# Patient Record
Sex: Female | Born: 1960 | Race: White | Hispanic: No | Marital: Married | Smoking: Former smoker
Health system: Southern US, Community
[De-identification: ages and names within clinical notes are randomized; demographics above are authoritative.]

## PROBLEM LIST (undated history)

## (undated) DIAGNOSIS — F419 Anxiety disorder, unspecified: Secondary | ICD-10-CM

## (undated) DIAGNOSIS — F32A Depression, unspecified: Secondary | ICD-10-CM

## (undated) DIAGNOSIS — K219 Gastro-esophageal reflux disease without esophagitis: Secondary | ICD-10-CM

## (undated) DIAGNOSIS — M1990 Unspecified osteoarthritis, unspecified site: Secondary | ICD-10-CM

## (undated) DIAGNOSIS — F329 Major depressive disorder, single episode, unspecified: Secondary | ICD-10-CM

## (undated) DIAGNOSIS — R7303 Prediabetes: Secondary | ICD-10-CM

## (undated) HISTORY — PX: HYSTEROSCOPY: SHX211

## (undated) HISTORY — PX: WISDOM TOOTH EXTRACTION: SHX21

---

## 1999-05-20 ENCOUNTER — Other Ambulatory Visit: Admission: RE | Admit: 1999-05-20 | Discharge: 1999-05-20 | Payer: Self-pay | Admitting: Gynecology

## 1999-11-04 ENCOUNTER — Ambulatory Visit (HOSPITAL_COMMUNITY): Admission: RE | Admit: 1999-11-04 | Discharge: 1999-11-04 | Payer: Self-pay | Admitting: Gynecology

## 1999-11-04 ENCOUNTER — Encounter (INDEPENDENT_AMBULATORY_CARE_PROVIDER_SITE_OTHER): Payer: Self-pay | Admitting: Specialist

## 2000-03-14 ENCOUNTER — Encounter: Admission: RE | Admit: 2000-03-14 | Discharge: 2000-03-14 | Payer: Self-pay | Admitting: Internal Medicine

## 2000-03-14 ENCOUNTER — Encounter: Payer: Self-pay | Admitting: Internal Medicine

## 2000-06-05 ENCOUNTER — Other Ambulatory Visit: Admission: RE | Admit: 2000-06-05 | Discharge: 2000-06-05 | Payer: Self-pay | Admitting: Gynecology

## 2000-08-03 ENCOUNTER — Encounter: Admission: RE | Admit: 2000-08-03 | Discharge: 2000-08-03 | Payer: Self-pay | Admitting: Gynecology

## 2000-08-03 ENCOUNTER — Encounter: Payer: Self-pay | Admitting: Gynecology

## 2001-07-01 ENCOUNTER — Other Ambulatory Visit: Admission: RE | Admit: 2001-07-01 | Discharge: 2001-07-01 | Payer: Self-pay | Admitting: Gynecology

## 2001-08-08 ENCOUNTER — Encounter: Admission: RE | Admit: 2001-08-08 | Discharge: 2001-08-08 | Payer: Self-pay | Admitting: Gynecology

## 2001-08-08 ENCOUNTER — Encounter: Payer: Self-pay | Admitting: Gynecology

## 2002-04-28 ENCOUNTER — Ambulatory Visit (HOSPITAL_COMMUNITY): Admission: RE | Admit: 2002-04-28 | Discharge: 2002-04-28 | Payer: Self-pay | Admitting: Surgery

## 2002-04-28 ENCOUNTER — Encounter: Payer: Self-pay | Admitting: Surgery

## 2002-04-30 ENCOUNTER — Encounter: Admission: RE | Admit: 2002-04-30 | Discharge: 2002-04-30 | Payer: Self-pay | Admitting: Surgery

## 2002-04-30 ENCOUNTER — Encounter: Payer: Self-pay | Admitting: Surgery

## 2002-07-07 ENCOUNTER — Other Ambulatory Visit: Admission: RE | Admit: 2002-07-07 | Discharge: 2002-07-07 | Payer: Self-pay | Admitting: Gynecology

## 2002-08-22 ENCOUNTER — Encounter: Admission: RE | Admit: 2002-08-22 | Discharge: 2002-08-22 | Payer: Self-pay | Admitting: Gynecology

## 2002-08-22 ENCOUNTER — Encounter: Payer: Self-pay | Admitting: Gynecology

## 2003-08-17 ENCOUNTER — Other Ambulatory Visit: Admission: RE | Admit: 2003-08-17 | Discharge: 2003-08-17 | Payer: Self-pay | Admitting: Gynecology

## 2003-08-25 ENCOUNTER — Encounter: Admission: RE | Admit: 2003-08-25 | Discharge: 2003-08-25 | Payer: Self-pay | Admitting: Gynecology

## 2003-08-25 ENCOUNTER — Encounter: Payer: Self-pay | Admitting: Gynecology

## 2004-08-29 ENCOUNTER — Other Ambulatory Visit: Admission: RE | Admit: 2004-08-29 | Discharge: 2004-08-29 | Payer: Self-pay | Admitting: Gynecology

## 2004-08-30 ENCOUNTER — Encounter: Admission: RE | Admit: 2004-08-30 | Discharge: 2004-08-30 | Payer: Self-pay | Admitting: Gynecology

## 2004-09-05 ENCOUNTER — Encounter: Admission: RE | Admit: 2004-09-05 | Discharge: 2004-09-05 | Payer: Self-pay | Admitting: Gynecology

## 2005-09-26 ENCOUNTER — Other Ambulatory Visit: Admission: RE | Admit: 2005-09-26 | Discharge: 2005-09-26 | Payer: Self-pay | Admitting: Gynecology

## 2005-11-14 ENCOUNTER — Encounter: Admission: RE | Admit: 2005-11-14 | Discharge: 2005-11-14 | Payer: Self-pay | Admitting: Gynecology

## 2006-10-30 ENCOUNTER — Other Ambulatory Visit: Admission: RE | Admit: 2006-10-30 | Discharge: 2006-10-30 | Payer: Self-pay | Admitting: Gynecology

## 2006-12-05 ENCOUNTER — Encounter: Admission: RE | Admit: 2006-12-05 | Discharge: 2006-12-05 | Payer: Self-pay | Admitting: Gynecology

## 2006-12-14 ENCOUNTER — Encounter: Admission: RE | Admit: 2006-12-14 | Discharge: 2006-12-14 | Payer: Self-pay | Admitting: Gynecology

## 2008-05-20 ENCOUNTER — Encounter: Admission: RE | Admit: 2008-05-20 | Discharge: 2008-05-20 | Payer: Self-pay | Admitting: Gynecology

## 2008-11-20 ENCOUNTER — Emergency Department (HOSPITAL_COMMUNITY): Admission: EM | Admit: 2008-11-20 | Discharge: 2008-11-20 | Payer: Self-pay | Admitting: Emergency Medicine

## 2009-06-30 ENCOUNTER — Encounter: Admission: RE | Admit: 2009-06-30 | Discharge: 2009-06-30 | Payer: Self-pay | Admitting: Gynecology

## 2010-07-06 ENCOUNTER — Encounter: Admission: RE | Admit: 2010-07-06 | Discharge: 2010-07-06 | Payer: Self-pay | Admitting: Gynecology

## 2011-01-08 ENCOUNTER — Encounter: Payer: Self-pay | Admitting: Gynecology

## 2011-07-13 ENCOUNTER — Other Ambulatory Visit: Payer: Self-pay | Admitting: Gynecology

## 2011-07-13 DIAGNOSIS — Z1231 Encounter for screening mammogram for malignant neoplasm of breast: Secondary | ICD-10-CM

## 2011-08-11 ENCOUNTER — Ambulatory Visit
Admission: RE | Admit: 2011-08-11 | Discharge: 2011-08-11 | Disposition: A | Discharge status: Home / Self Care | Payer: BC Managed Care – PPO | Source: Ambulatory Visit | Attending: Gynecology | Admitting: Gynecology

## 2011-08-11 DIAGNOSIS — Z1231 Encounter for screening mammogram for malignant neoplasm of breast: Secondary | ICD-10-CM

## 2011-08-12 ENCOUNTER — Emergency Department (HOSPITAL_BASED_OUTPATIENT_CLINIC_OR_DEPARTMENT_OTHER)
Admission: EM | Admit: 2011-08-12 | Discharge: 2011-08-12 | Disposition: A | Discharge status: Home / Self Care | Payer: BC Managed Care – PPO | Attending: Emergency Medicine | Admitting: Emergency Medicine

## 2011-08-12 ENCOUNTER — Other Ambulatory Visit: Payer: Self-pay

## 2011-08-12 ENCOUNTER — Encounter (HOSPITAL_BASED_OUTPATIENT_CLINIC_OR_DEPARTMENT_OTHER): Payer: Self-pay | Admitting: Emergency Medicine

## 2011-08-12 ENCOUNTER — Emergency Department (INDEPENDENT_AMBULATORY_CARE_PROVIDER_SITE_OTHER): Payer: BC Managed Care – PPO

## 2011-08-12 DIAGNOSIS — K219 Gastro-esophageal reflux disease without esophagitis: Secondary | ICD-10-CM | POA: Insufficient documentation

## 2011-08-12 DIAGNOSIS — R079 Chest pain, unspecified: Secondary | ICD-10-CM

## 2011-08-12 DIAGNOSIS — F341 Dysthymic disorder: Secondary | ICD-10-CM | POA: Insufficient documentation

## 2011-08-12 HISTORY — DX: Gastro-esophageal reflux disease without esophagitis: K21.9

## 2011-08-12 HISTORY — DX: Major depressive disorder, single episode, unspecified: F32.9

## 2011-08-12 HISTORY — DX: Anxiety disorder, unspecified: F41.9

## 2011-08-12 HISTORY — DX: Depression, unspecified: F32.A

## 2011-08-12 NOTE — ED Provider Notes (Signed)
History     CSN: [id number] Arrival date & time: 08/12/2011 11:12 AM  Chief Complaint  Patient presents with  . Chest Pain   HPI Complains of anterior chest pain onset 3 or 4 days ago initially intermittent, constant since 8 AM today which resolved without treatment upon arrival to the emergency department .brought on by stress. Patient has been under considerable emotional stress recently due to her mother's having a stroke and her daughters moving away to college. Discomfort is nonexertional over anterior chest pressure-like nonradiating no associated shortness of breath nausea or sweatiness. Asymptomatic as I examine her. On further history by telephone from Dr. Waynard Edwards, patient was seen in the office recently for similar complaint also felt secondary to emotional stress Past Medical History  Diagnosis Date  . Depression   . Anxiety   . GERD (gastroesophageal reflux disease)   . Migraine    cardiac risk factors none.  Past Surgical History  Procedure Date  . Cesarean section   . Hysteroscopy     History reviewed. No pertinent family history.  History  Substance Use Topics  . Smoking status: Former Games developer  . Smokeless tobacco: Not on file  . Alcohol Use: Yes     social   quit smoking 12 years ago  OB History    Grav Para Term Preterm Abortions TAB SAB Ect Mult Living                  Review of Systems  Constitutional: Negative.   HENT: Negative.   Respiratory: Negative.   Cardiovascular: Positive for chest pain.  Gastrointestinal: Negative.   Musculoskeletal: Negative.   Skin: Negative.   Neurological: Negative.   Hematological: Negative.   Psychiatric/Behavioral: Negative.        Anxiety    Physical Exam  BP 115/63  Pulse 66  Temp(Src) 97.8 F (36.6 C) (Oral)  Resp 16  Ht 5\' 7"  (1.702 m)  Wt 193 lb (87.544 kg)  BMI 30.23 kg/m2  SpO2 100%  Physical Exam  Nursing note and vitals reviewed. Constitutional: She appears well-developed and well-nourished.   HENT:  Head: Normocephalic and atraumatic.  Eyes: Conjunctivae are normal. Pupils are equal, round, and reactive to light.  Neck: Neck supple. No tracheal deviation present. No thyromegaly present.  Cardiovascular: Normal rate and regular rhythm.   No murmur heard. Pulmonary/Chest: Effort normal and breath sounds normal.  Abdominal: Soft. Bowel sounds are normal. She exhibits no distension. There is no tenderness.  Musculoskeletal: Normal range of motion. She exhibits no edema and no tenderness.  Neurological: She is alert. Coordination normal.  Skin: Skin is warm and dry. No rash noted.  Psychiatric: She has a normal mood and affect.    Date: 08/12/2011  Rate: 65  Rhythm: normal sinus rhythm  QRS Axis: normal  Intervals: normal  ST/T Wave abnormalities: normal  Conduction Disutrbances:none  Narrative Interpretation:   Old EKG Reviewed: none available  ED Course  Procedures  MDM Doubt cardiac etiology with atypical symptoms not acute EKG negative cardiac markers symptoms only brought on by stress. Plan with Dr. Cindee Lame, MD 08/12/11 [phone]

## 2011-08-12 NOTE — ED Notes (Signed)
Patient is resting comfortably. Has returned from XR; eating lunch brought by husband (MD aware); no needs at this time.

## 2011-08-12 NOTE — ED Notes (Signed)
Pt reports mid chest tightness x several days; more constant now; reports she is under a lot of stress at this time.

## 2011-09-22 LAB — POCT I-STAT, CHEM 8
Calcium, Ion: 1.14 mmol/L (ref 1.12–1.32)
Potassium: 3.4 mEq/L — ABNORMAL LOW (ref 3.5–5.1)
TCO2: 24 mmol/L (ref 0–100)

## 2011-09-22 LAB — PROTIME-INR
INR: 0.9 (ref 0.00–1.49)
Prothrombin Time: 12.2 seconds (ref 11.6–15.2)

## 2011-09-22 LAB — CBC
Hemoglobin: 12.4 g/dL (ref 12.0–15.0)
MCHC: 34.1 g/dL (ref 30.0–36.0)
MCV: 92.7 fL (ref 78.0–100.0)
Platelets: 316 10*3/uL (ref 150–400)
WBC: 6.2 10*3/uL (ref 4.0–10.5)

## 2012-09-02 ENCOUNTER — Other Ambulatory Visit: Payer: Self-pay | Admitting: Gynecology

## 2012-09-02 DIAGNOSIS — Z1231 Encounter for screening mammogram for malignant neoplasm of breast: Secondary | ICD-10-CM

## 2012-09-18 ENCOUNTER — Ambulatory Visit: Payer: BC Managed Care – PPO

## 2012-09-26 ENCOUNTER — Ambulatory Visit
Admission: RE | Admit: 2012-09-26 | Discharge: 2012-09-26 | Disposition: A | Discharge status: Home / Self Care | Payer: PRIVATE HEALTH INSURANCE | Source: Ambulatory Visit | Attending: Gynecology | Admitting: Gynecology

## 2012-09-26 DIAGNOSIS — Z1231 Encounter for screening mammogram for malignant neoplasm of breast: Secondary | ICD-10-CM

## 2013-12-05 ENCOUNTER — Other Ambulatory Visit: Payer: Self-pay

## 2013-12-05 DIAGNOSIS — Z1231 Encounter for screening mammogram for malignant neoplasm of breast: Secondary | ICD-10-CM

## 2014-01-06 ENCOUNTER — Ambulatory Visit
Admission: RE | Admit: 2014-01-06 | Discharge: 2014-01-06 | Disposition: A | Discharge status: Home / Self Care | Payer: BC Managed Care – PPO | Source: Ambulatory Visit

## 2014-01-06 DIAGNOSIS — Z1231 Encounter for screening mammogram for malignant neoplasm of breast: Secondary | ICD-10-CM

## 2018-11-05 ENCOUNTER — Encounter (HOSPITAL_BASED_OUTPATIENT_CLINIC_OR_DEPARTMENT_OTHER): Payer: Self-pay | Admitting: *Deleted

## 2018-11-05 ENCOUNTER — Other Ambulatory Visit: Payer: Self-pay

## 2018-11-05 ENCOUNTER — Emergency Department (HOSPITAL_BASED_OUTPATIENT_CLINIC_OR_DEPARTMENT_OTHER): Payer: BLUE CROSS/BLUE SHIELD

## 2018-11-05 ENCOUNTER — Emergency Department (HOSPITAL_BASED_OUTPATIENT_CLINIC_OR_DEPARTMENT_OTHER)
Admission: EM | Admit: 2018-11-05 | Discharge: 2018-11-05 | Disposition: A | Discharge status: Home / Self Care | Payer: BLUE CROSS/BLUE SHIELD | Attending: Emergency Medicine | Admitting: Emergency Medicine

## 2018-11-05 DIAGNOSIS — Z87891 Personal history of nicotine dependence: Secondary | ICD-10-CM | POA: Diagnosis not present

## 2018-11-05 DIAGNOSIS — Z3202 Encounter for pregnancy test, result negative: Secondary | ICD-10-CM | POA: Diagnosis not present

## 2018-11-05 DIAGNOSIS — K8080 Other cholelithiasis without obstruction: Secondary | ICD-10-CM | POA: Insufficient documentation

## 2018-11-05 DIAGNOSIS — Z79899 Other long term (current) drug therapy: Secondary | ICD-10-CM | POA: Insufficient documentation

## 2018-11-05 DIAGNOSIS — K8020 Calculus of gallbladder without cholecystitis without obstruction: Secondary | ICD-10-CM

## 2018-11-05 DIAGNOSIS — R1011 Right upper quadrant pain: Secondary | ICD-10-CM

## 2018-11-05 LAB — URINALYSIS, ROUTINE W REFLEX MICROSCOPIC
Bilirubin Urine: NEGATIVE
GLUCOSE, UA: NEGATIVE mg/dL
Ketones, ur: NEGATIVE mg/dL
Nitrite: NEGATIVE
PH: 7 (ref 5.0–8.0)
Protein, ur: NEGATIVE mg/dL
Specific Gravity, Urine: 1.015 (ref 1.005–1.030)

## 2018-11-05 LAB — CBC WITH DIFFERENTIAL/PLATELET
ABS IMMATURE GRANULOCYTES: 0.03 10*3/uL (ref 0.00–0.07)
BASOS ABS: 0.1 10*3/uL (ref 0.0–0.1)
Basophils Relative: 1 %
Eosinophils Absolute: 0.1 10*3/uL (ref 0.0–0.5)
Eosinophils Relative: 2 %
HCT: 39.4 % (ref 36.0–46.0)
HEMOGLOBIN: 13 g/dL (ref 12.0–15.0)
Immature Granulocytes: 0 %
LYMPHS PCT: 11 %
Lymphs Abs: 1.1 10*3/uL (ref 0.7–4.0)
MCH: 30.9 pg (ref 26.0–34.0)
MCHC: 33 g/dL (ref 30.0–36.0)
MCV: 93.6 fL (ref 80.0–100.0)
Monocytes Absolute: 0.7 10*3/uL (ref 0.1–1.0)
Monocytes Relative: 8 %
NEUTROS ABS: 7.3 10*3/uL (ref 1.7–7.7)
NEUTROS PCT: 78 %
NRBC: 0 % (ref 0.0–0.2)
PLATELETS: 305 10*3/uL (ref 150–400)
RBC: 4.21 MIL/uL (ref 3.87–5.11)
RDW: 12 % (ref 11.5–15.5)
WBC: 9.3 10*3/uL (ref 4.0–10.5)

## 2018-11-05 LAB — COMPREHENSIVE METABOLIC PANEL
ALBUMIN: 4 g/dL (ref 3.5–5.0)
ALK PHOS: 37 U/L — AB (ref 38–126)
ALT: 32 U/L (ref 0–44)
ANION GAP: 9 (ref 5–15)
AST: 52 U/L — ABNORMAL HIGH (ref 15–41)
BUN: 15 mg/dL (ref 6–20)
CHLORIDE: 105 mmol/L (ref 98–111)
CO2: 23 mmol/L (ref 22–32)
Calcium: 9.5 mg/dL (ref 8.9–10.3)
Creatinine, Ser: 0.67 mg/dL (ref 0.44–1.00)
GFR calc Af Amer: >60 mL/min (ref >60)
GFR calc non Af Amer: >60 mL/min (ref >60)
GLUCOSE: 97 mg/dL (ref 70–99)
Potassium: 3.8 mmol/L (ref 3.5–5.1)
SODIUM: 137 mmol/L (ref 135–145)
Total Bilirubin: 1.5 mg/dL — ABNORMAL HIGH (ref 0.3–1.2)
Total Protein: 7.2 g/dL (ref 6.5–8.1)

## 2018-11-05 LAB — URINALYSIS, MICROSCOPIC (REFLEX)

## 2018-11-05 LAB — I-STAT CG4 LACTIC ACID, ED: Lactic Acid, Venous: 0.71 mmol/L (ref 0.5–1.9)

## 2018-11-05 LAB — PREGNANCY, URINE: PREG TEST UR: NEGATIVE

## 2018-11-05 LAB — LIPASE, BLOOD: Lipase: 49 U/L (ref 11–51)

## 2018-11-05 MED ORDER — ONDANSETRON HCL 4 MG PO TABS: 4.0000 mg | ORAL_TABLET | Freq: Three times a day (TID) | ORAL | 0 refills | Status: DC | PRN

## 2018-11-05 MED ORDER — OXYCODONE HCL 5 MG PO TABS: 5.0000 mg | ORAL_TABLET | ORAL | 0 refills | Status: DC | PRN

## 2018-11-05 NOTE — Discharge Instructions (Signed)
Your work-up and ultrasound today revealed evidence of gallstones are likely causing her symptoms.  He did not have evidence of acute cholecystitis and your laboratory testing other than slight elevation of liver function was reassuring.  As he did not require significant pain medications at this time.  You were feeling well, we feel you are safe for outpatient management of this problem.  Please stay hydrated and have a bland diet avoiding greasy foods.  Please call both your PCP and a general surgeon for further outpatient management.  We provided prescription for pain medicine nausea medicine to help with your symptoms worsen however if the symptoms return and are unmanageable, please return to the nearest emergency department for further management.

## 2018-11-05 NOTE — ED Notes (Signed)
Patient transported to Ultrasound 

## 2018-11-05 NOTE — ED Provider Notes (Signed)
[facility] EMERGENCY DEPARTMENT Provider Note   CSN: [id number] Arrival date & time: 11/05/18  1232     History   Chief Complaint Chief Complaint  Patient presents with  . Abdominal Pain    HPI Mary Moran is a 57 y.o. female.  The history is provided by the patient, the spouse and medical records. No language interpreter was used.  Abdominal Pain   This is a recurrent problem. The current episode started 3 to 5 hours ago. The problem occurs constantly. The problem has been gradually improving. The pain is associated with eating. The pain is located in the RUQ. The quality of the pain is sharp and aching. The pain is severe. Associated symptoms include anorexia and nausea. Pertinent negatives include fever, diarrhea, vomiting, constipation, dysuria, frequency, hematuria and headaches. The symptoms are aggravated by palpation and eating. Nothing relieves the symptoms.    Past Medical History:  Diagnosis Date  . Anxiety   . Depression   . GERD (gastroesophageal reflux disease)   . Migraine     There are no active problems to display for this patient.   Past Surgical History:  Procedure Laterality Date  . CESAREAN SECTION    . HYSTEROSCOPY       OB History   None      Home Medications    Prior to Admission medications   Medication Sig Start Date End Date Taking? Authorizing Provider  cholecalciferol (VITAMIN D) 1000 UNITS tablet Take 1,000 Units by mouth daily.     Yes [provider]  fish oil-omega-3 fatty acids 1000 MG capsule Take 1 capsule by mouth daily.     Yes [provider]  Pantoprazole Sodium (PROTONIX PO) Take by mouth.   Yes [provider]  SERTRALINE HCL PO Take 150 mg by mouth daily.     Yes [provider]  BACLOFEN PO Take by mouth as needed.      [provider]  Multiple Vitamin (MULTIVITAMIN PO) Take 1 tablet by mouth daily.      [provider]  Topiramate (TOPAMAX PO)  Take 150 mg by mouth daily.      [provider]  vitamin C (ASCORBIC ACID) 500 MG tablet Take 500 mg by mouth daily.      [provider]    Family History No family history on file.  Social History Social History   Tobacco Use  . Smoking status: Former Games developermoker  . Smokeless tobacco: Never Used  Substance Use Topics  . Alcohol use: Yes    Comment: social  . Drug use: No     Allergies   Codeine   Review of Systems Review of Systems  Constitutional: Negative for chills, diaphoresis, fatigue and fever.  HENT: Negative for congestion.   Eyes: Negative for visual disturbance.  Respiratory: Negative for cough, chest tightness, shortness of breath and wheezing.   Cardiovascular: Negative for chest pain, palpitations and leg swelling.  Gastrointestinal: Positive for abdominal pain, anorexia and nausea. Negative for abdominal distention, constipation, diarrhea and vomiting.  Genitourinary: Positive for flank pain. Negative for decreased urine volume, dysuria, frequency, hematuria, pelvic pain, vaginal bleeding, vaginal discharge and vaginal pain.  Musculoskeletal: Positive for back pain (R flank). Negative for neck pain and neck stiffness.  Neurological: Negative for light-headedness, numbness and headaches.  Psychiatric/Behavioral: Negative for agitation.  All other systems reviewed and are negative.    Physical Exam Updated Vital Signs BP (!) 189/78 (BP Location: Right Arm)  Pulse 65   Temp 98.5 F (36.9 C) (Oral)   Resp 18   Ht 5\' 7"  (1.702 m)   Wt 88.5 kg   SpO2 99%   BMI 30.54 kg/m   Physical Exam  Constitutional: She appears well-developed and well-nourished. No distress.  HENT:  Head: Normocephalic and atraumatic.  Mouth/Throat: Oropharynx is clear and moist. No oropharyngeal exudate.  Eyes: Pupils are equal, round, and reactive to light. Conjunctivae and EOM are normal. No scleral icterus.  Neck: Neck supple.  Cardiovascular: Normal rate  and regular rhythm.  No murmur heard. Pulmonary/Chest: Effort normal and breath sounds normal. No respiratory distress. She has no wheezes. She has no rales. She exhibits no tenderness.  Abdominal: Soft. Normal appearance and bowel sounds are normal. She exhibits no distension. There is tenderness in the right upper quadrant and epigastric area. There is no rigidity, no rebound and no CVA tenderness.    Musculoskeletal: She exhibits tenderness. She exhibits no edema.       Thoracic back: She exhibits tenderness and pain.       Back:  Neurological: She is alert.  Skin: Skin is warm and dry. Capillary refill takes less than 2 seconds. She is not diaphoretic.  Psychiatric: She has a normal mood and affect.  Nursing note and vitals reviewed.    ED Treatments / Results  Labs (all labs ordered are listed, but only abnormal results are displayed) Labs Reviewed  URINALYSIS, ROUTINE W REFLEX MICROSCOPIC - Abnormal; Notable for the following components:      Result Value   Hgb urine dipstick TRACE (*)    Leukocytes, UA SMALL (*)    All other components within normal limits  URINALYSIS, MICROSCOPIC (REFLEX) - Abnormal; Notable for the following components:   Bacteria, UA FEW (*)    All other components within normal limits  COMPREHENSIVE METABOLIC PANEL - Abnormal; Notable for the following components:   AST 52 (*)    Alkaline Phosphatase 37 (*)    Total Bilirubin 1.5 (*)    All other components within normal limits  URINE CULTURE  CBC WITH DIFFERENTIAL/PLATELET  LIPASE, BLOOD  PREGNANCY, URINE  I-STAT CG4 LACTIC ACID, ED  I-STAT CG4 LACTIC ACID, ED    EKG None  Radiology US Abdomen Limited Ruq  Result Date: 11/05/2018 CLINICAL DATA:  Right upper quadrant abdominal pain EXAM: ULTRASOUND ABDOMEN LIMITED RIGHT UPPER QUADRANT COMPARISON:  Report from 04/28/2002 CT FINDINGS: Gallbladder: Physiologic distention of the gallbladder without mural thickening. The single wall thickness is  2.3 mm. No pericholecystic fluid is noted. Gallstones are seen along the dependent aspect of the gallbladder the largest approximately 1.2 cm near the neck. Common bile duct: Diameter: Normal at 4 mm Liver: Increased echogenicity of the liver parenchyma without space-occupying mass. Portal vein is patent on color Doppler imaging with normal direction of blood flow towards the liver. IMPRESSION: 1. Cholelithiasis without complicating features. 2. Mild increased in liver echogenicity may be seen with fatty infiltration/steatosis of the liver. Electronically Signed   By: Tollie Eth M.D.   On: 11/05/2018 15:19    Procedures Procedures (including critical care time)  Medications Ordered in ED Medications - No data to display   Initial Impression / Assessment and Plan / ED Course  I have reviewed the triage vital signs and the nursing notes.  Pertinent labs & imaging results that were available during my care of the patient were reviewed by me and considered in my medical decision making (see chart for details).  Mary Moran is a 57 y.o. female with a past medical history significant for anxiety, GERD, depression and migraines who presents for abdominal pain.  Patient reports that over the last year she is had 3 or 4 episodes of similar right upper quadrant and right flank abdominal pain.  She reports that typically it gets better after some rest and Alka-Seltzer.  She says that today she had breakfast and several minutes later started having the right upper quadrant and epigastric abdominal discomfort.  She describes as up to 10 out of 10 in severity and is currently a 3 out of 10.  She says that the pain is sharp and aching.  It radiates around her side towards her back.  She denies fevers or chills.  She reports that last week she had a URI and was given azithromycin which she completed.  She reports no other respiratory symptoms.  No chest pain or shortness of breath.  No palpitations.  She  reports nausea but no vomiting.  No constipation or diarrhea but she says she typically goes several days without a bowel movement.  No history of kidney stones, gallstones, cholecystitis, appendicitis, or diverticulitis.  No history of abdominal trauma.  No urinary symptoms or other GI symptoms to report.  No rashes or history of shingles on the side.  On exam, patient has tenderness in the epigastrium and right upper quadrant.  Bowel sounds were appreciated.  No report and flatus change.  Lungs clear chest was nontender.  CVA area nontender.  Based on her symptoms especially after eating, I am concerned patient may have a biliary etiology of symptoms.  Patient will have labs and be made n.p.o.  She will have her upper quadrant ultrasound initially.  Febrile child is reassuring and patient still having discomfort, will consider CT scan to further evaluate.    Anticipate reassessment after work-up.  She does not want pain or nausea medicine on arrival.  Patient's laboratory testing was reassuring.  Mild elevation in LFTs.  Patient's ultrasound showed cholelithiasis but no acute cholecystitis.  No pericholecystic fluid and negative Murphy sign.  Mild steatosis also seen.    Given her symptoms and description, I suspect that she is having symptom medic cholelithiasis.  As patient has not required pain medicine or nausea medicine here, she is likely safe for outpatient management of the gallstones causing her discomfort.  Patient will follow-up with her PCP and an outpatient surgeon for reassessment and further management.  I suspect patient will need a cholecystectomy in the near future.    Blood was seen in her urine however given the gallstones seen I suspect this is the cause rather than a kidney stone.  Patient was given strict return precautions for any new or worsened symptoms.  She was given prescription for pain and nausea medicine to help with the cholelithiasis symptoms return.  If she is  unable to eat or drink she understands return precautions.  Patient had no other worsens or concerns and was discharged in good condition with improved symptoms.  Final Clinical Impressions(s) / ED Diagnoses   Final diagnoses:  None    ED Discharge Orders    None      Clinical Impression: 1. Right upper quadrant abdominal pain   2. RUQ abdominal pain   3. Symptomatic cholelithiasis     Disposition: Discharge  Condition: Good  I have discussed the results, Dx and Tx plan with the pt(& family if present). He/she/they expressed understanding and agree(s) with the  plan. Discharge instructions discussed at great length. Strict return precautions discussed and pt &/or family have verbalized understanding of the instructions. No further questions at time of discharge.    Discharge Medication List as of 11/05/2018  3:53 PM    START taking these medications   Details  ondansetron (ZOFRAN) 4 MG tablet Take 1 tablet (4 mg total) by mouth every 8 (eight) hours as needed for nausea or vomiting., Starting Tue 11/05/2018, Print    oxyCODONE (ROXICODONE) 5 MG immediate release tablet Take 1 tablet (5 mg total) by mouth every 4 (four) hours as needed for severe pain., Starting Tue 11/05/2018, Print        Follow Up: Rodrigo Ran, MD [address] [place] [address] [phone]     Surgery, [place] [address] [place] [address] [phone]        Tegeler, Canary Brim, MD 11/05/18 [phone]

## 2018-11-05 NOTE — ED Triage Notes (Signed)
Abdominal pain this am. Pain feels like "heart burn". Pain is right upper quadrant pain.

## 2018-11-06 LAB — URINE CULTURE: CULTURE: NO GROWTH

## 2018-11-07 ENCOUNTER — Other Ambulatory Visit: Payer: Self-pay

## 2018-11-07 ENCOUNTER — Ambulatory Visit: Payer: Self-pay | Admitting: Surgery

## 2018-11-07 ENCOUNTER — Encounter (HOSPITAL_COMMUNITY): Payer: Self-pay | Admitting: *Deleted

## 2018-11-07 NOTE — H&P (View-Only) (Signed)
Surgical H&P  CC: abdominal pain  HPI: this is a very pleasant and relatively healthy 57 year old woman who is referred with biliary colic.  She has had 3 or 4 bouts of vague indigestion and upper abdominal pain in the last year, but 2 days ago had such a severe attack of upper middle and right abdominal pain associated with nausea that she presented to the med Center at [facility].  Her workup there included a right upper quadrant ultrasound that demonstrated gallstones but no cholecystitis, see report below.  She had no white blood cell count or fever but she had mild elevation in her AST and ALT, total bilirubin was 1.5.  The pain ultimately resolved and she was able to be discharged home.  She's not had any recurrence of the pain and today she is feeling well.  Has not had any jaundice.  No prior abdominal surgeries except for C-section.  Allergies  Allergen Reactions  . Codeine Nausea Only    Past Medical History:  Diagnosis Date  . Anxiety   . Depression   . GERD (gastroesophageal reflux disease)   . Migraine     Past Surgical History:  Procedure Laterality Date  . CESAREAN SECTION    . HYSTEROSCOPY      No family history on file.  Social History   Socioeconomic History  . Marital status: Married    Spouse name: Not on file  . Number of children: Not on file  . Years of education: Not on file  . Highest education level: Not on file  Occupational History  . Not on file  Social Needs  . Financial resource strain: Not on file  . Food insecurity:    Worry: Not on file    Inability: Not on file  . Transportation needs:    Medical: Not on file    Non-medical: Not on file  Tobacco Use  . Smoking status: Former Games developer  . Smokeless tobacco: Never Used  Substance and Sexual Activity  . Alcohol use: Yes    Comment: social  . Drug use: No  . Sexual activity: Not on file  Lifestyle  . Physical activity:    Days per week: Not on file    Minutes per session: Not on file   . Stress: Not on file  Relationships  . Social connections:    Talks on phone: Not on file    Gets together: Not on file    Attends religious service: Not on file    Active member of club or organization: Not on file    Attends meetings of clubs or organizations: Not on file    Relationship status: Not on file  Other Topics Concern  . Not on file  Social History Narrative  . Not on file    Current Outpatient Medications on File Prior to Visit  Medication Sig Dispense Refill  . BACLOFEN PO Take by mouth as needed.      . cholecalciferol (VITAMIN D) 1000 UNITS tablet Take 1,000 Units by mouth daily.      . fish oil-omega-3 fatty acids 1000 MG capsule Take 1 capsule by mouth daily.      . Multiple Vitamin (MULTIVITAMIN PO) Take 1 tablet by mouth daily.      . ondansetron (ZOFRAN) 4 MG tablet Take 1 tablet (4 mg total) by mouth every 8 (eight) hours as needed for nausea or vomiting. 12 tablet 0  . oxyCODONE (ROXICODONE) 5 MG immediate release tablet Take 1 tablet (  5 mg total) by mouth every 4 (four) hours as needed for severe pain. 12 tablet 0  . Pantoprazole Sodium (PROTONIX PO) Take by mouth.    . SERTRALINE HCL PO Take 150 mg by mouth daily.      . Topiramate (TOPAMAX PO) Take 150 mg by mouth daily.      . vitamin C (ASCORBIC ACID) 500 MG tablet Take 500 mg by mouth daily.       No current facility-administered medications on file prior to visit.     Review of Systems: a complete, 10pt review of systems was completed with pertinent positives and negatives as documented in the HPI  Physical Exam: 97.7. 103. 150/80. BMI 31 Gen: A&Ox3, no distress  Head: normocephalic, atraumatic Eyes: extraocular motions intact, anicteric.  Neck: supple without mass or thyromegaly Chest: unlabored respirations, symmetrical air entry, clear bilaterally   Cardiovascular: RRR with palpable distal pulses, no pedal edema Abdomen: soft, nondistended, very minimally subjectively tender in the  epigastrium and right upper quadrant. No mass or organomegaly.  Extremities: warm, without edema, no deformities  Neuro: grossly intact Psych: appropriate mood and affect, normal insight  Skin: warm and dry   CBC Latest Ref Rng & Units 11/05/2018 11/20/2008 11/20/2008  WBC 4.0 - 10.5 K/uL 9.3 - 6.2  Hemoglobin 12.0 - 15.0 g/dL 16.1 09.6 04.5  Hematocrit 36.0 - 46.0 % 39.4 36.0 36.3  Platelets 150 - 400 K/uL 305 - 316    CMP Latest Ref Rng & Units 11/05/2018 11/20/2008  Glucose 70 - 99 mg/dL 97 409(W)  BUN 6 - 20 mg/dL 15 11  Creatinine 1.19 - 1.00 mg/dL 1.47 0.9  Sodium 829 - 145 mmol/L 137 139  Potassium 3.5 - 5.1 mmol/L 3.8 3.4(L)  Chloride 98 - 111 mmol/L 105 106  CO2 22 - 32 mmol/L 23 -  Calcium 8.9 - 10.3 mg/dL 9.5 -  Total Protein 6.5 - 8.1 g/dL 7.2 -  Total Bilirubin 0.3 - 1.2 mg/dL 5.6(O) -  Alkaline Phos 38 - 126 U/L 37(L) -  AST 15 - 41 U/L 52(H) -  ALT 0 - 44 U/L 32 -    Lab Results  Component Value Date   INR 0.9 11/20/2008    Imaging: US Abdomen Limited Ruq  Result Date: 11/05/2018 CLINICAL DATA:  Right upper quadrant abdominal pain EXAM: ULTRASOUND ABDOMEN LIMITED RIGHT UPPER QUADRANT COMPARISON:  Report from 04/28/2002 CT FINDINGS: Gallbladder: Physiologic distention of the gallbladder without mural thickening. The single wall thickness is 2.3 mm. No pericholecystic fluid is noted. Gallstones are seen along the dependent aspect of the gallbladder the largest approximately 1.2 cm near the neck. Common bile duct: Diameter: Normal at 4 mm Liver: Increased echogenicity of the liver parenchyma without space-occupying mass. Portal vein is patent on color Doppler imaging with normal direction of blood flow towards the liver. IMPRESSION: 1. Cholelithiasis without complicating features. 2. Mild increased in liver echogenicity may be seen with fatty infiltration/steatosis of the liver. Electronically Signed   By: Tollie Eth M.D.   On: 11/05/2018 15:19     A/P: biliary  colic. I recommend proceeding with laparoscopic cholecystectomy with possible cholangiogram. Discussed risks of surgery including bleeding, pain, scarring, intraabdominal injury specifically to the common bile duct and sequelae, conversion to open surgery, blood clot, pneumonia, heart attack, stroke, failure to resolve symptoms, etc/ Questions welcomed and answered.She and her husband are going on a cruise on December 21 and are hoping to complete the surgery prior to this, I  referred him to our scheduling department to see what can be done.  Discussed low-fat diet and precautions about when to return to the emergency room.    Phylliss Blakeshelsea Henny Strauch, MD [facility] [place] Pager [phone]

## 2018-11-07 NOTE — H&P (Signed)
Surgical H&P  CC: abdominal pain  HPI: this is a very pleasant and relatively healthy 57-year-old woman who is referred with biliary colic.  She has had 3 or 4 bouts of vague indigestion and upper abdominal pain in the last year, but 2 days ago had such a severe attack of upper middle and right abdominal pain associated with nausea that she presented to the med Center at [place].  Her workup there included a right upper quadrant ultrasound that demonstrated gallstones but no cholecystitis, see report below.  She had no white blood cell count or fever but she had mild elevation in her AST and ALT, total bilirubin was 1.5.  The pain ultimately resolved and she was able to be discharged home.  She's not had any recurrence of the pain and today she is feeling well.  Has not had any jaundice.  No prior abdominal surgeries except for C-section.  Allergies  Allergen Reactions  . Codeine Nausea Only    Past Medical History:  Diagnosis Date  . Anxiety   . Depression   . GERD (gastroesophageal reflux disease)   . Migraine     Past Surgical History:  Procedure Laterality Date  . CESAREAN SECTION    . HYSTEROSCOPY      No family history on file.  Social History   Socioeconomic History  . Marital status: Married    Spouse name: Not on file  . Number of children: Not on file  . Years of education: Not on file  . Highest education level: Not on file  Occupational History  . Not on file  Social Needs  . Financial resource strain: Not on file  . Food insecurity:    Worry: Not on file    Inability: Not on file  . Transportation needs:    Medical: Not on file    Non-medical: Not on file  Tobacco Use  . Smoking status: Former Smoker  . Smokeless tobacco: Never Used  Substance and Sexual Activity  . Alcohol use: Yes    Comment: social  . Drug use: No  . Sexual activity: Not on file  Lifestyle  . Physical activity:    Days per week: Not on file    Minutes per session: Not on file   . Stress: Not on file  Relationships  . Social connections:    Talks on phone: Not on file    Gets together: Not on file    Attends religious service: Not on file    Active member of club or organization: Not on file    Attends meetings of clubs or organizations: Not on file    Relationship status: Not on file  Other Topics Concern  . Not on file  Social History Narrative  . Not on file    Current Outpatient Medications on File Prior to Visit  Medication Sig Dispense Refill  . BACLOFEN PO Take by mouth as needed.      . cholecalciferol (VITAMIN D) 1000 UNITS tablet Take 1,000 Units by mouth daily.      . fish oil-omega-3 fatty acids 1000 MG capsule Take 1 capsule by mouth daily.      . Multiple Vitamin (MULTIVITAMIN PO) Take 1 tablet by mouth daily.      . ondansetron (ZOFRAN) 4 MG tablet Take 1 tablet (4 mg total) by mouth every 8 (eight) hours as needed for nausea or vomiting. 12 tablet 0  . oxyCODONE (ROXICODONE) 5 MG immediate release tablet Take 1 tablet (  5 mg total) by mouth every 4 (four) hours as needed for severe pain. 12 tablet 0  . Pantoprazole Sodium (PROTONIX PO) Take by mouth.    . SERTRALINE HCL PO Take 150 mg by mouth daily.      . Topiramate (TOPAMAX PO) Take 150 mg by mouth daily.      . vitamin C (ASCORBIC ACID) 500 MG tablet Take 500 mg by mouth daily.       No current facility-administered medications on file prior to visit.     Review of Systems: a complete, 10pt review of systems was completed with pertinent positives and negatives as documented in the HPI  Physical Exam: 97.7. 103. 150/80. BMI 31 Gen: A&Ox3, no distress  Head: normocephalic, atraumatic Eyes: extraocular motions intact, anicteric.  Neck: supple without mass or thyromegaly Chest: unlabored respirations, symmetrical air entry, clear bilaterally   Cardiovascular: RRR with palpable distal pulses, no pedal edema Abdomen: soft, nondistended, very minimally subjectively tender in the  epigastrium and right upper quadrant. No mass or organomegaly.  Extremities: warm, without edema, no deformities  Neuro: grossly intact Psych: appropriate mood and affect, normal insight  Skin: warm and dry   CBC Latest Ref Rng & Units 11/05/2018 11/20/2008 11/20/2008  WBC 4.0 - 10.5 K/uL 9.3 - 6.2  Hemoglobin 12.0 - 15.0 g/dL 13.0 12.2 12.4  Hematocrit 36.0 - 46.0 % 39.4 36.0 36.3  Platelets 150 - 400 K/uL 305 - 316    CMP Latest Ref Rng & Units 11/05/2018 11/20/2008  Glucose 70 - 99 mg/dL 97 101(H)  BUN 6 - 20 mg/dL 15 11  Creatinine 0.44 - 1.00 mg/dL 0.67 0.9  Sodium 135 - 145 mmol/L 137 139  Potassium 3.5 - 5.1 mmol/L 3.8 3.4(L)  Chloride 98 - 111 mmol/L 105 106  CO2 22 - 32 mmol/L 23 -  Calcium 8.9 - 10.3 mg/dL 9.5 -  Total Protein 6.5 - 8.1 g/dL 7.2 -  Total Bilirubin 0.3 - 1.2 mg/dL 1.5(H) -  Alkaline Phos 38 - 126 U/L 37(L) -  AST 15 - 41 U/L 52(H) -  ALT 0 - 44 U/L 32 -    Lab Results  Component Value Date   INR 0.9 11/20/2008    Imaging: Us Abdomen Limited Ruq  Result Date: 11/05/2018 CLINICAL DATA:  Right upper quadrant abdominal pain EXAM: ULTRASOUND ABDOMEN LIMITED RIGHT UPPER QUADRANT COMPARISON:  Report from 04/28/2002 CT FINDINGS: Gallbladder: Physiologic distention of the gallbladder without mural thickening. The single wall thickness is 2.3 mm. No pericholecystic fluid is noted. Gallstones are seen along the dependent aspect of the gallbladder the largest approximately 1.2 cm near the neck. Common bile duct: Diameter: Normal at 4 mm Liver: Increased echogenicity of the liver parenchyma without space-occupying mass. Portal vein is patent on color Doppler imaging with normal direction of blood flow towards the liver. IMPRESSION: 1. Cholelithiasis without complicating features. 2. Mild increased in liver echogenicity may be seen with fatty infiltration/steatosis of the liver. Electronically Signed   By: David  Kwon M.D.   On: 11/05/2018 15:19     A/P: biliary  colic. I recommend proceeding with laparoscopic cholecystectomy with possible cholangiogram. Discussed risks of surgery including bleeding, pain, scarring, intraabdominal injury specifically to the common bile duct and sequelae, conversion to open surgery, blood clot, pneumonia, heart attack, stroke, failure to resolve symptoms, etc/ Questions welcomed and answered.She and her husband are going on a cruise on December 21 and are hoping to complete the surgery prior to this, I   referred him to our scheduling department to see what can be done.  Discussed low-fat diet and precautions about when to return to the emergency room.    Anitria Andon, MD [facility] Pager [phone]   

## 2018-11-11 MED ORDER — BUPIVACAINE LIPOSOME 1.3 % IJ SUSP
20.0000 mL | INTRAMUSCULAR | Status: DC
  Filled 2018-11-11: qty 20

## 2018-11-12 ENCOUNTER — Ambulatory Visit (HOSPITAL_COMMUNITY): Payer: BLUE CROSS/BLUE SHIELD | Admitting: Anesthesiology

## 2018-11-12 ENCOUNTER — Encounter (HOSPITAL_COMMUNITY)
Admission: RE | Disposition: A | Discharge status: Home / Self Care | Payer: Self-pay | Source: Ambulatory Visit | Attending: Surgery

## 2018-11-12 ENCOUNTER — Ambulatory Visit (HOSPITAL_COMMUNITY)
Admission: RE | Admit: 2018-11-12 | Discharge: 2018-11-12 | Disposition: A | Discharge status: Home / Self Care | Payer: BLUE CROSS/BLUE SHIELD | Source: Ambulatory Visit | Attending: Surgery | Admitting: Surgery

## 2018-11-12 ENCOUNTER — Other Ambulatory Visit: Payer: Self-pay

## 2018-11-12 ENCOUNTER — Ambulatory Visit (HOSPITAL_COMMUNITY): Payer: BLUE CROSS/BLUE SHIELD

## 2018-11-12 ENCOUNTER — Encounter (HOSPITAL_COMMUNITY): Payer: Self-pay | Admitting: Anesthesiology

## 2018-11-12 DIAGNOSIS — Z87891 Personal history of nicotine dependence: Secondary | ICD-10-CM | POA: Insufficient documentation

## 2018-11-12 DIAGNOSIS — K219 Gastro-esophageal reflux disease without esophagitis: Secondary | ICD-10-CM | POA: Insufficient documentation

## 2018-11-12 DIAGNOSIS — Z79899 Other long term (current) drug therapy: Secondary | ICD-10-CM | POA: Diagnosis not present

## 2018-11-12 DIAGNOSIS — F329 Major depressive disorder, single episode, unspecified: Secondary | ICD-10-CM | POA: Insufficient documentation

## 2018-11-12 DIAGNOSIS — K8010 Calculus of gallbladder with chronic cholecystitis without obstruction: Secondary | ICD-10-CM | POA: Diagnosis not present

## 2018-11-12 DIAGNOSIS — Z79890 Hormone replacement therapy (postmenopausal): Secondary | ICD-10-CM | POA: Insufficient documentation

## 2018-11-12 DIAGNOSIS — K8050 Calculus of bile duct without cholangitis or cholecystitis without obstruction: Secondary | ICD-10-CM

## 2018-11-12 DIAGNOSIS — K8070 Calculus of gallbladder and bile duct without cholecystitis without obstruction: Secondary | ICD-10-CM | POA: Diagnosis present

## 2018-11-12 HISTORY — DX: Prediabetes: R73.03

## 2018-11-12 HISTORY — DX: Unspecified osteoarthritis, unspecified site: M19.90

## 2018-11-12 HISTORY — PX: CHOLECYSTECTOMY: SHX55

## 2018-11-12 SURGERY — LAPAROSCOPIC CHOLECYSTECTOMY WITH INTRAOPERATIVE CHOLANGIOGRAM
Anesthesia: General | Site: Abdomen

## 2018-11-12 MED ORDER — OXYCODONE HCL 5 MG PO TABS: 5.0000 mg | ORAL_TABLET | Freq: Once | ORAL | Status: DC | PRN

## 2018-11-12 MED ORDER — FENTANYL CITRATE (PF) 100 MCG/2ML IJ SOLN
INTRAMUSCULAR | Status: AC
  Filled 2018-11-12: qty 2

## 2018-11-12 MED ORDER — CHLORHEXIDINE GLUCONATE 4 % EX LIQD: 60.0000 mL | Freq: Once | CUTANEOUS | Status: DC

## 2018-11-12 MED ORDER — IOPAMIDOL (ISOVUE-300) INJECTION 61%
INTRAVENOUS | Status: DC | PRN
  Administered 2018-11-12: 4 mL

## 2018-11-12 MED ORDER — BUPIVACAINE-EPINEPHRINE (PF) 0.25% -1:200000 IJ SOLN
INTRAMUSCULAR | Status: AC
  Filled 2018-11-12: qty 30

## 2018-11-12 MED ORDER — SUGAMMADEX SODIUM 200 MG/2ML IV SOLN
INTRAVENOUS | Status: DC | PRN
  Administered 2018-11-12: 200 mg via INTRAVENOUS

## 2018-11-12 MED ORDER — MIDAZOLAM HCL 2 MG/2ML IJ SOLN
INTRAMUSCULAR | Status: AC
  Filled 2018-11-12: qty 2

## 2018-11-12 MED ORDER — LACTATED RINGERS IV SOLN
INTRAVENOUS | Status: DC | PRN
  Administered 2018-11-12: 06:00:00 via INTRAVENOUS
  Administered 2018-11-12: 1000 mL via INTRAVENOUS

## 2018-11-12 MED ORDER — KETOROLAC TROMETHAMINE 30 MG/ML IJ SOLN: 30.0000 mg | Freq: Once | INTRAMUSCULAR | Status: DC | PRN

## 2018-11-12 MED ORDER — MIDAZOLAM HCL 5 MG/5ML IJ SOLN
INTRAMUSCULAR | Status: DC | PRN
  Administered 2018-11-12: 2 mg via INTRAVENOUS

## 2018-11-12 MED ORDER — LACTATED RINGERS IR SOLN
Status: DC | PRN
  Administered 2018-11-12: 1000 mL

## 2018-11-12 MED ORDER — ONDANSETRON HCL 4 MG/2ML IJ SOLN
INTRAMUSCULAR | Status: DC | PRN
  Administered 2018-11-12: 4 mg via INTRAVENOUS

## 2018-11-12 MED ORDER — DEXAMETHASONE SODIUM PHOSPHATE 10 MG/ML IJ SOLN
INTRAMUSCULAR | Status: DC | PRN
  Administered 2018-11-12: 10 mg via INTRAVENOUS

## 2018-11-12 MED ORDER — BUPIVACAINE-EPINEPHRINE 0.25% -1:200000 IJ SOLN
INTRAMUSCULAR | Status: DC | PRN
  Administered 2018-11-12: 27 mL

## 2018-11-12 MED ORDER — LIDOCAINE 2% (20 MG/ML) 5 ML SYRINGE
INTRAMUSCULAR | Status: AC
  Filled 2018-11-12: qty 10

## 2018-11-12 MED ORDER — GABAPENTIN 300 MG PO CAPS
300.0000 mg | ORAL_CAPSULE | ORAL | Status: AC
  Administered 2018-11-12: 300 mg via ORAL
  Filled 2018-11-12: qty 1

## 2018-11-12 MED ORDER — ROCURONIUM BROMIDE 10 MG/ML (PF) SYRINGE
PREFILLED_SYRINGE | INTRAVENOUS | Status: DC | PRN
  Administered 2018-11-12: 60 mg via INTRAVENOUS

## 2018-11-12 MED ORDER — 0.9 % SODIUM CHLORIDE (POUR BTL) OPTIME
TOPICAL | Status: DC | PRN
  Administered 2018-11-12: 1000 mL

## 2018-11-12 MED ORDER — PROMETHAZINE HCL 25 MG/ML IJ SOLN
INTRAMUSCULAR | Status: AC
  Filled 2018-11-12: qty 1

## 2018-11-12 MED ORDER — ACETAMINOPHEN 500 MG PO TABS
1000.0000 mg | ORAL_TABLET | ORAL | Status: AC
  Administered 2018-11-12: 1000 mg via ORAL
  Filled 2018-11-12: qty 2

## 2018-11-12 MED ORDER — FENTANYL CITRATE (PF) 100 MCG/2ML IJ SOLN
INTRAMUSCULAR | Status: DC | PRN
  Administered 2018-11-12 (×2): 50 ug via INTRAVENOUS

## 2018-11-12 MED ORDER — SUGAMMADEX SODIUM 200 MG/2ML IV SOLN
INTRAVENOUS | Status: AC
  Filled 2018-11-12: qty 2

## 2018-11-12 MED ORDER — FENTANYL CITRATE (PF) 100 MCG/2ML IJ SOLN: 25.0000 ug | INTRAMUSCULAR | Status: DC | PRN

## 2018-11-12 MED ORDER — OXYCODONE HCL 5 MG/5ML PO SOLN: 5.0000 mg | Freq: Once | ORAL | Status: DC | PRN

## 2018-11-12 MED ORDER — SCOPOLAMINE 1 MG/3DAYS TD PT72
MEDICATED_PATCH | TRANSDERMAL | Status: AC
  Administered 2018-11-12: 1.5 mg via TRANSDERMAL
  Filled 2018-11-12: qty 1

## 2018-11-12 MED ORDER — LACTATED RINGERS IV SOLN: INTRAVENOUS | Status: DC

## 2018-11-12 MED ORDER — PROMETHAZINE HCL 25 MG/ML IJ SOLN
6.2500 mg | INTRAMUSCULAR | Status: DC | PRN
  Administered 2018-11-12: 6.25 mg via INTRAVENOUS

## 2018-11-12 MED ORDER — LIDOCAINE 2% (20 MG/ML) 5 ML SYRINGE
INTRAMUSCULAR | Status: DC | PRN
  Administered 2018-11-12: 60 mg via INTRAVENOUS

## 2018-11-12 MED ORDER — PROPOFOL 10 MG/ML IV BOLUS
INTRAVENOUS | Status: DC | PRN
  Administered 2018-11-12: 150 mg via INTRAVENOUS

## 2018-11-12 MED ORDER — EPHEDRINE SULFATE-NACL 50-0.9 MG/10ML-% IV SOSY
PREFILLED_SYRINGE | INTRAVENOUS | Status: DC | PRN
  Administered 2018-11-12: 5 mg via INTRAVENOUS
  Administered 2018-11-12: 10 mg via INTRAVENOUS

## 2018-11-12 MED ORDER — DEXAMETHASONE SODIUM PHOSPHATE 10 MG/ML IJ SOLN
INTRAMUSCULAR | Status: AC
  Filled 2018-11-12: qty 2

## 2018-11-12 MED ORDER — SCOPOLAMINE 1 MG/3DAYS TD PT72
1.0000 | MEDICATED_PATCH | TRANSDERMAL | Status: DC
  Administered 2018-11-12: 1.5 mg via TRANSDERMAL

## 2018-11-12 MED ORDER — ROCURONIUM BROMIDE 100 MG/10ML IV SOLN
INTRAVENOUS | Status: AC
  Filled 2018-11-12: qty 2

## 2018-11-12 MED ORDER — PROPOFOL 10 MG/ML IV BOLUS
INTRAVENOUS | Status: AC
  Filled 2018-11-12: qty 40

## 2018-11-12 MED ORDER — CELECOXIB 200 MG PO CAPS
200.0000 mg | ORAL_CAPSULE | ORAL | Status: AC
  Administered 2018-11-12: 200 mg via ORAL
  Filled 2018-11-12: qty 1

## 2018-11-12 MED ORDER — CEFAZOLIN SODIUM-DEXTROSE 2-4 GM/100ML-% IV SOLN
2.0000 g | INTRAVENOUS | Status: AC
  Administered 2018-11-12: 2 g via INTRAVENOUS
  Filled 2018-11-12: qty 100

## 2018-11-12 MED ORDER — IOPAMIDOL (ISOVUE-300) INJECTION 61%
INTRAVENOUS | Status: AC
  Filled 2018-11-12: qty 50

## 2018-11-12 MED ORDER — ONDANSETRON HCL 4 MG/2ML IJ SOLN
INTRAMUSCULAR | Status: AC
  Filled 2018-11-12: qty 4

## 2018-11-12 SURGICAL SUPPLY — 36 items
ADH SKN CLS APL DERMABOND .7 (GAUZE/BANDAGES/DRESSINGS) ×1
APPLIER CLIP ROT 10 11.4 M/L (STAPLE) ×2
APR CLP MED LRG 11.4X10 (STAPLE) ×1
BAG SPEC RTRVL LRG 6X4 10 (ENDOMECHANICALS) ×1
CABLE HIGH FREQUENCY MONO STRZ (ELECTRODE) ×2 IMPLANT
CHLORAPREP W/TINT 26ML (MISCELLANEOUS) ×2 IMPLANT
CLIP APPLIE ROT 10 11.4 M/L (STAPLE) ×1 IMPLANT
COVER MAYO STAND STRL (DRAPES) IMPLANT
COVER SURGICAL LIGHT HANDLE (MISCELLANEOUS) ×2 IMPLANT
COVER WAND RF STERILE (DRAPES) IMPLANT
DECANTER SPIKE VIAL GLASS SM (MISCELLANEOUS) ×2 IMPLANT
DERMABOND ADVANCED (GAUZE/BANDAGES/DRESSINGS) ×1
DERMABOND ADVANCED .7 DNX12 (GAUZE/BANDAGES/DRESSINGS) ×1 IMPLANT
DRAPE C-ARM 42X120 X-RAY (DRAPES) IMPLANT
ELECT REM PT RETURN 15FT ADLT (MISCELLANEOUS) ×2 IMPLANT
GLOVE BIO SURGEON STRL SZ 6 (GLOVE) ×2 IMPLANT
GLOVE INDICATOR 6.5 STRL GRN (GLOVE) ×2 IMPLANT
GOWN STRL REUS W/TWL LRG LVL3 (GOWN DISPOSABLE) ×2 IMPLANT
GOWN STRL REUS W/TWL XL LVL3 (GOWN DISPOSABLE) ×4 IMPLANT
GRASPER SUT TROCAR 14GX15 (MISCELLANEOUS) IMPLANT
HEMOSTAT SNOW SURGICEL 2X4 (HEMOSTASIS) IMPLANT
KIT BASIN OR (CUSTOM PROCEDURE TRAY) ×2 IMPLANT
NDL INSUFFLATION 14GA 120MM (NEEDLE) ×1 IMPLANT
NEEDLE INSUFFLATION 14GA 120MM (NEEDLE) ×2 IMPLANT
POUCH SPECIMEN RETRIEVAL 10MM (ENDOMECHANICALS) ×2 IMPLANT
SCISSORS LAP 5X35 DISP (ENDOMECHANICALS) ×2 IMPLANT
SET CHOLANGIOGRAPH MIX (MISCELLANEOUS) IMPLANT
SET IRRIG TUBING LAPAROSCOPIC (IRRIGATION / IRRIGATOR) ×2 IMPLANT
SLEEVE XCEL OPT CAN 5 100 (ENDOMECHANICALS) ×4 IMPLANT
SUT MNCRL AB 4-0 PS2 18 (SUTURE) ×2 IMPLANT
TOWEL OR 17X26 10 PK STRL BLUE (TOWEL DISPOSABLE) ×2 IMPLANT
TOWEL OR NON WOVEN STRL DISP B (DISPOSABLE) IMPLANT
TRAY LAPAROSCOPIC (CUSTOM PROCEDURE TRAY) ×2 IMPLANT
TROCAR BLADELESS OPT 5 100 (ENDOMECHANICALS) ×2 IMPLANT
TROCAR XCEL 12X100 BLDLESS (ENDOMECHANICALS) ×2 IMPLANT
TUBING INSUF HEATED (TUBING) ×2 IMPLANT

## 2018-11-12 NOTE — Transfer of Care (Signed)
Immediate Anesthesia Transfer of Care Note  Patient: Mary Moran  Procedure(s) Performed: Procedure(s): LAPAROSCOPIC CHOLECYSTECTOMY WITH INTRAOPERATIVE CHOLANGIOGRAM (N/A)  Patient Location: PACU  Anesthesia Type:General  Level of Consciousness:  sedated, patient cooperative and responds to stimulation  Airway & Oxygen Therapy:Patient Spontanous Breathing and Patient connected to face mask oxgen  Post-op Assessment:  Report given to PACU RN and Post -op Vital signs reviewed and stable  Post vital signs:  Reviewed and stable  Last Vitals:  Vitals:   11/12/18 0828 11/12/18 0830  BP: (!) 150/77 (!) 156/69  Pulse: 82 84  Resp: 17 (!) 24  Temp: (P) 36.4 C   SpO2: 99% 97%    Complications: No apparent anesthesia complications

## 2018-11-12 NOTE — Anesthesia Preprocedure Evaluation (Signed)
Anesthesia Evaluation  Patient identified by MRN, date of birth, ID band Patient awake    Reviewed: Allergy & Precautions, NPO status , Patient's Chart, lab work & pertinent test results  Airway Mallampati: II  TM Distance: >3 FB Neck ROM: Full    Dental no notable dental hx.    Pulmonary neg pulmonary ROS, former smoker,    Pulmonary exam normal breath sounds clear to auscultation       Cardiovascular negative cardio ROS Normal cardiovascular exam Rhythm:Regular Rate:Normal     Neuro/Psych negative neurological ROS  negative psych ROS   GI/Hepatic negative GI ROS, Neg liver ROS,   Endo/Other  negative endocrine ROS  Renal/GU negative Renal ROS  negative genitourinary   Musculoskeletal negative musculoskeletal ROS (+)   Abdominal   Peds negative pediatric ROS (+)  Hematology negative hematology ROS (+)   Anesthesia Other Findings   Reproductive/Obstetrics negative OB ROS                             Anesthesia Physical Anesthesia Plan  ASA: II  Anesthesia Plan: General   Post-op Pain Management:    Induction: Intravenous  PONV Risk Score and Plan: 3 and Ondansetron, Dexamethasone, Treatment may vary due to age or medical condition and Midazolam  Airway Management Planned: Oral ETT  Additional Equipment:   Intra-op Plan:   Post-operative Plan: Extubation in OR  Informed Consent: I have reviewed the patients History and Physical, chart, labs and discussed the procedure including the risks, benefits and alternatives for the proposed anesthesia with the patient or authorized representative who has indicated his/her understanding and acceptance.   Dental advisory given  Plan Discussed with: CRNA and Surgeon  Anesthesia Plan Comments:         Anesthesia Quick Evaluation

## 2018-11-12 NOTE — Interval H&P Note (Signed)
History and Physical Interval Note:  11/12/2018 7:02 AM  Mary Moran  has presented today for surgery, with the diagnosis of biliary colic  The various methods of treatment have been discussed with the patient and family. After consideration of risks, benefits and other options for treatment, the patient has consented to  Procedure(s): LAPAROSCOPIC CHOLECYSTECTOMY WITH INTRAOPERATIVE CHOLANGIOGRAM (N/A) as a surgical intervention .  The patient's history has been reviewed, patient examined, no change in status, stable for surgery.  I have reviewed the patient's chart and labs.  Questions were answered to the patient's satisfaction.     Jaymeson Mengel Lollie SailsA [name]

## 2018-11-12 NOTE — Anesthesia Procedure Notes (Signed)
Procedure Name: Intubation Date/Time: 11/12/2018 7:42 AM Performed by: Lavina Hamman, CRNA Pre-anesthesia Checklist: Patient identified, Emergency Drugs available, Suction available, Patient being monitored and Timeout performed Patient Re-evaluated:Patient Re-evaluated prior to induction Oxygen Delivery Method: Circle system utilized Preoxygenation: Pre-oxygenation with 100% oxygen Induction Type: IV induction Ventilation: Mask ventilation without difficulty Laryngoscope Size: Mac and 4 Grade View: Grade I Tube type: Oral Tube size: 7.0 mm Number of attempts: 1 Airway Equipment and Method: Stylet Placement Confirmation: ETT inserted through vocal cords under direct vision,  positive ETCO2,  CO2 detector and breath sounds checked- equal and bilateral Secured at: 21 cm Tube secured with: Tape Dental Injury: Teeth and Oropharynx as per pre-operative assessment

## 2018-11-12 NOTE — Discharge Instructions (Addendum)
LAPAROSCOPIC SURGERY: POST OP INSTRUCTIONS  ######################################################################  EAT Gradually transition to a high fiber diet with a fiber supplement over the next few weeks after discharge.  Start with a pureed / full liquid diet (see below)  WALK Walk an hour a day.  Control your pain to do that.    CONTROL PAIN Control pain so that you can walk, sleep, tolerate sneezing/coughing, go up/down stairs.  HAVE A BOWEL MOVEMENT DAILY Keep your bowels regular to avoid problems.  OK to try a laxative to override constipation.  OK to use an antidairrheal to slow down diarrhea.  Call if not better after 2 tries  CALL IF YOU HAVE PROBLEMS/CONCERNS Call if you are still struggling despite following these instructions. Call if you have concerns not answered by these instructions  ######################################################################    1. DIET: Follow a light bland diet the first 24 hours after arrival home, such as soup, liquids, crackers, etc.  Be sure to include lots of fluids daily.  Avoid fast food or heavy meals as your are more likely to get nauseated.  Eat a low fat the next few days after surgery.    2. Take your usually prescribed home medications unless otherwise directed.  3. PAIN CONTROL: a. Pain is best controlled by a usual combination of three different methods TOGETHER: i. Ice/Heat ii. Over the counter pain medication iii. Prescription pain medication b. Most patients will experience some swelling and bruising around the incisions.  Ice packs or heating pads (30-60 minutes up to 6 times a day) will help. Use ice for the first few days to help decrease swelling and bruising, then switch to heat to help relax tight/sore spots and speed recovery.  Some people prefer to use ice alone, heat alone, alternating between ice & heat.  Experiment to what works for you.  Swelling and bruising can take several weeks to resolve.   c. It  is helpful to take an over-the-counter pain medication regularly for the first few weeks.  Choose one of the following that works best for you: i. Naproxen (Aleve, etc)  Two 220mg  tabs twice a day ii. Ibuprofen (Advil, etc) Three 200mg  tabs four times a day (every meal & bedtime) iii. Acetaminophen (Tylenol, etc) 500-650mg  four times a day (every meal & bedtime) d. A  prescription for pain medication (such as oxycodone, hydrocodone, tramadol, gabapentin, methocarbamol, etc) was given to you by the ER last week.  Take your pain medication as prescribed if needed.  i. If you are having problems/concerns with the prescription medicine (does not control pain, nausea, vomiting, rash, itching, etc), please call us [phone] to see if we need to switch you to a different pain medicine that will work better for you and/or control your side effect better. ii. If you need a refill on your pain medication, please give [place] 48 hour notice.  contact your pharmacy.  They will contact our office to request authorization. Prescriptions will not be filled after 5 pm or on week-ends  4. Avoid getting constipated.   a. Between the surgery and the pain medications, it is common to experience some constipation.   b. Increasing fluid intake and taking a fiber supplement (such as Metamucil, Citrucel, FiberCon, MiraLax, etc) 1-2 times a day regularly will usually help prevent this problem from occurring.   c. A mild laxative (prune juice, Milk of Magnesia, MiraLax, etc) should be taken according to package directions if there are no bowel movements after 48 hours.  5. Watch out for diarrhea.   a. If you have many loose bowel movements, simplify your diet to bland foods & liquids for a few days.   b. Stop any stool softeners and decrease your fiber supplement.   c. Switching to mild anti-diarrheal medications (Kayopectate, Pepto Bismol) can help.   d. If this worsens or does not improve, please call us.  6. Wash /  shower every day.  You may shower over the skin glue which is waterproof.  Continue to shower over incision(s) after the dressing is off.  7. Skin glue will flake off after 2 weeks.  You may leave the incision open to air.  You may replace a dressing/Band-Aid to cover the incision for comfort if you wish.   8. ACTIVITIES as tolerated:   a. You may resume regular (light) daily activities beginning the next day--such as daily self-care, walking, climbing stairs--gradually increasing activities as tolerated.  If you can walk 30 minutes without difficulty, it is safe to try more intense activity such as jogging, treadmill, bicycling, low-impact aerobics, swimming, etc. b. Save the most intensive and strenuous activity for last such as sit-ups, heavy lifting, contact sports, etc  Refrain from any heavy lifting or straining until you are off narcotics for pain control.   c. DO NOT PUSH THROUGH PAIN.  Let pain be your guide: If it hurts to do something, don't do it.  Pain is your body warning you to avoid that activity for another week until the pain goes down. d. You may drive when you are no longer taking prescription pain medication, you can comfortably wear a seatbelt, and you can safely maneuver your car and apply brakes. e. Bonita Quin may have sexual intercourse when it is comfortable.  9. FOLLOW UP in our office a. Please call [facility] at [phone] to set up an appointment to see your surgeon in the office for a follow-up appointment approximately 2-3 weeks after your surgery. b. Make sure that you call for this appointment the day you arrive home to insure a convenient appointment time.  10. IF YOU HAVE DISABILITY OR FAMILY LEAVE FORMS, BRING THEM TO THE OFFICE FOR PROCESSING.  DO NOT GIVE THEM TO YOUR DOCTOR.   WHEN TO CALL us [phone]: 1. Poor pain control 2. Reactions / problems with new medications (rash/itching, nausea, etc)  3. Fever over 101.5 F (38.5 C) 4. Inability to  urinate 5. Nausea and/or vomiting 6. Worsening swelling or bruising 7. Continued bleeding from incision. 8. Increased pain, redness, or drainage from the incision   The clinic staff is available to answer your questions during regular business hours (8:30am-5pm).  Please dont hesitate to call and ask to speak to one of our nurses for clinical concerns.   If you have a medical emergency, go to the nearest emergency room or call 911.  A surgeon from [facility] is always on call at the [facility], [place] [address], [place], [place]  [address] ? MAIN: [phone] ? TOLL FREE: [phone] ?  FAX [phone] [email or web address]  General Anesthesia, Adult, Care After These instructions provide you with information about caring for yourself after your procedure. Your health care provider may also give you more specific instructions. Your treatment has been planned according to current medical practices, but problems sometimes occur. Call your health care provider if you have any problems or questions after your procedure. What can I expect after the procedure?  After the procedure, it is common to have:  Vomiting.  A sore throat.  Mental slowness.  It is common to feel:  Nauseous.  Cold or shivery.  Sleepy.  Tired.  Sore or achy, even in parts of your body where you did not have surgery.  Follow these instructions at home: For at least 24 hours after the procedure:  Do not: ? Participate in activities where you could fall or become injured. ? Drive. ? Use heavy machinery. ? Drink alcohol. ? Take sleeping pills or medicines that cause drowsiness. ? Make important decisions or sign legal documents. ? Take care of children on your own.  Rest. Eating and drinking  If you vomit, drink water, juice, or soup when you can drink without vomiting.  Drink enough fluid to keep your urine clear or pale  yellow.  Make sure you have little or no nausea before eating solid foods.  Follow the diet recommended by your health care provider. General instructions  Have a responsible adult stay with you until you are awake and alert.  Return to your normal activities as told by your health care provider. Ask your health care provider what activities are safe for you.  Take over-the-counter and prescription medicines only as told by your health care provider.  If you smoke, do not smoke without supervision.  Keep all follow-up visits as told by your health care provider. This is important. Contact a health care provider if:  You continue to have nausea or vomiting at home, and medicines are not helpful.  You cannot drink fluids or start eating again.  You cannot urinate after 8-12 hours.  You develop a skin rash.  You have fever.  You have increasing redness at the site of your procedure. Get help right away if:  You have difficulty breathing.  You have chest pain.  You have unexpected bleeding.  You feel that you are having a life-threatening or urgent problem. This information is not intended to replace advice given to you by your health care provider. Make sure you discuss any questions you have with your health care provider. Document Released: 03/12/2001 Document Revised: 05/08/2016 Document Reviewed: 11/18/2015 Elsevier Interactive Patient Education  [employer]2018 [employer]

## 2018-11-12 NOTE — Anesthesia Postprocedure Evaluation (Signed)
Anesthesia Post Note  Patient: Mary Moran  Procedure(s) Performed: LAPAROSCOPIC CHOLECYSTECTOMY WITH INTRAOPERATIVE CHOLANGIOGRAM (N/A Abdomen)     Patient location during evaluation: PACU Anesthesia Type: General Level of consciousness: awake and alert Pain management: pain level controlled Vital Signs Assessment: post-procedure vital signs reviewed and stable Respiratory status: spontaneous breathing, nonlabored ventilation, respiratory function stable and patient connected to nasal cannula oxygen Cardiovascular status: blood pressure returned to baseline and stable Postop Assessment: no apparent nausea or vomiting Anesthetic complications: no    Last Vitals:  Vitals:   11/12/18 0828 11/12/18 0830  BP: (!) 150/77 (!) 156/69  Pulse: 82 84  Resp: 17 (!) 24  Temp: 36.4 C   SpO2: 99% 97%    Last Pain:  Vitals:   11/12/18 0900  TempSrc:   PainSc: Asleep                 [place] Pelto S

## 2018-11-12 NOTE — Op Note (Signed)
Operative Note  Alinda SierrasSharleene Fiumara 57 y.o. female [id number]  11/12/2018  Surgeon: Berna Buehelsea A Tatiyana Foucher MD  Assistant: none  Procedure performed: Laparoscopic Cholecystectomy with cholangiogram  Preop diagnosis: biliary colic Post-op diagnosis/intraop findings: same  Specimens: gallbladder  EBL: minimal  Complications: none  Description of procedure: After obtaining informed consent the patient was brought to the operating room. Prophylactic antibiotics were administered. SCD's were applied. General endotracheal anesthesia was initiated and a formal time-out was performed. The abdomen was prepped and draped in the usual sterile fashion and the abdomen was entered using an infraumbilical veress needle after instilling the site with local. Insufflation to 15mmHg was obtained, 5mm trocar and camera inserted and gross inspection revealed no evidence of injury from our entry or other intraabdominal abnormalities. Two 5mm trocars were introduced in the right midclavicular and right anterior axillary lines under direct visualization and following infiltration with local. An 11mm trocar was placed in the epigastrium. The gallbladder was distended but blue in color without active inflammation. It was retracted cephalad and the infundibulum was retracted laterally. A combination of hook electrocautery and blunt dissection was utilized to clear the peritoneum from the neck and cystic duct, circumferentially isolating the cystic artery and cystic duct and lifting the gallbladder from the cystic plate. The critical view of safety was achieved with the cystic artery, cystic duct, and liver bed visualized between them with no other structures. The artery was clipped with a single clip proximally and distally and divided. A clip was placed on the distal cystic duct and a ductotomy sharply made into which the cholangiogram catheter was inserted and secured with a clip. Cholangiogram was performed which shows normal  ductal anatomy and ready emptying of contrast into the duodenum, no filling defects.  The catheter is removed and the proximal cystic duct ligated with 3 clips and divided distally.  The gallbladder was dissected from the liver plate using electrocautery. Once freed the gallbladder was placed in an endocatch bag and removed intact through the epigastric trocar site.  The right upper quadrant was irrigated and aspirated; the effluent was clear. Hemostasis was once again confirmed, and reinspection of the abdomen revealed no injuries. The clips were well opposed without any bile leak from the duct or the liver bed. The 11mm trocar site in the epigastrium was closed with a 0 vicryl in the fascia under direct visualization using a PMI device. The abdomen was desufflated and all trocars removed. The skin incisions were closed with running subcuticular monocryl and Dermabond. The patient was awakened, extubated and transported to the recovery room in stable condition.   All counts were correct at the completion of the case.

## 2018-11-13 ENCOUNTER — Encounter (HOSPITAL_COMMUNITY): Payer: Self-pay | Admitting: Surgery

## 2019-07-31 ENCOUNTER — Other Ambulatory Visit: Payer: Self-pay | Admitting: Family Medicine

## 2019-07-31 DIAGNOSIS — M79671 Pain in right foot: Secondary | ICD-10-CM

## 2019-08-08 ENCOUNTER — Ambulatory Visit
Admission: RE | Admit: 2019-08-08 | Discharge: 2019-08-08 | Disposition: A | Discharge status: Home / Self Care | Payer: BC Managed Care – PPO | Source: Ambulatory Visit | Attending: Family Medicine | Admitting: Family Medicine

## 2019-08-08 DIAGNOSIS — M79671 Pain in right foot: Secondary | ICD-10-CM

## 2019-09-03 ENCOUNTER — Other Ambulatory Visit: Payer: Self-pay | Admitting: Gynecology

## 2019-09-03 DIAGNOSIS — R928 Other abnormal and inconclusive findings on diagnostic imaging of breast: Secondary | ICD-10-CM

## 2019-09-15 ENCOUNTER — Other Ambulatory Visit: Payer: Self-pay | Admitting: Gynecology

## 2019-09-15 ENCOUNTER — Other Ambulatory Visit: Payer: Self-pay

## 2019-09-15 ENCOUNTER — Ambulatory Visit
Admission: RE | Admit: 2019-09-15 | Discharge: 2019-09-15 | Disposition: A | Discharge status: Home / Self Care | Payer: BC Managed Care – PPO | Source: Ambulatory Visit | Attending: Gynecology | Admitting: Gynecology

## 2019-09-15 DIAGNOSIS — N6310 Unspecified lump in the right breast, unspecified quadrant: Secondary | ICD-10-CM

## 2019-09-15 DIAGNOSIS — R928 Other abnormal and inconclusive findings on diagnostic imaging of breast: Secondary | ICD-10-CM

## 2019-09-22 IMAGING — RF DG CHOLANGIOGRAM OPERATIVE
1 series · 4 of 4 positions shown · non-contrast
Comparison: 11/05/2008

CLINICAL DATA: Biliary colic

EXAM:
INTRAOPERATIVE CHOLANGIOGRAM
TECHNIQUE: Cholangiographic images from the C-arm fluoroscopic device were
submitted for interpretation post-operatively. Please see the
procedural report for the amount of contrast and the fluoroscopy
time utilized.

[Series 1: run · 4 of 74 frames shown]
[frame 12/74]
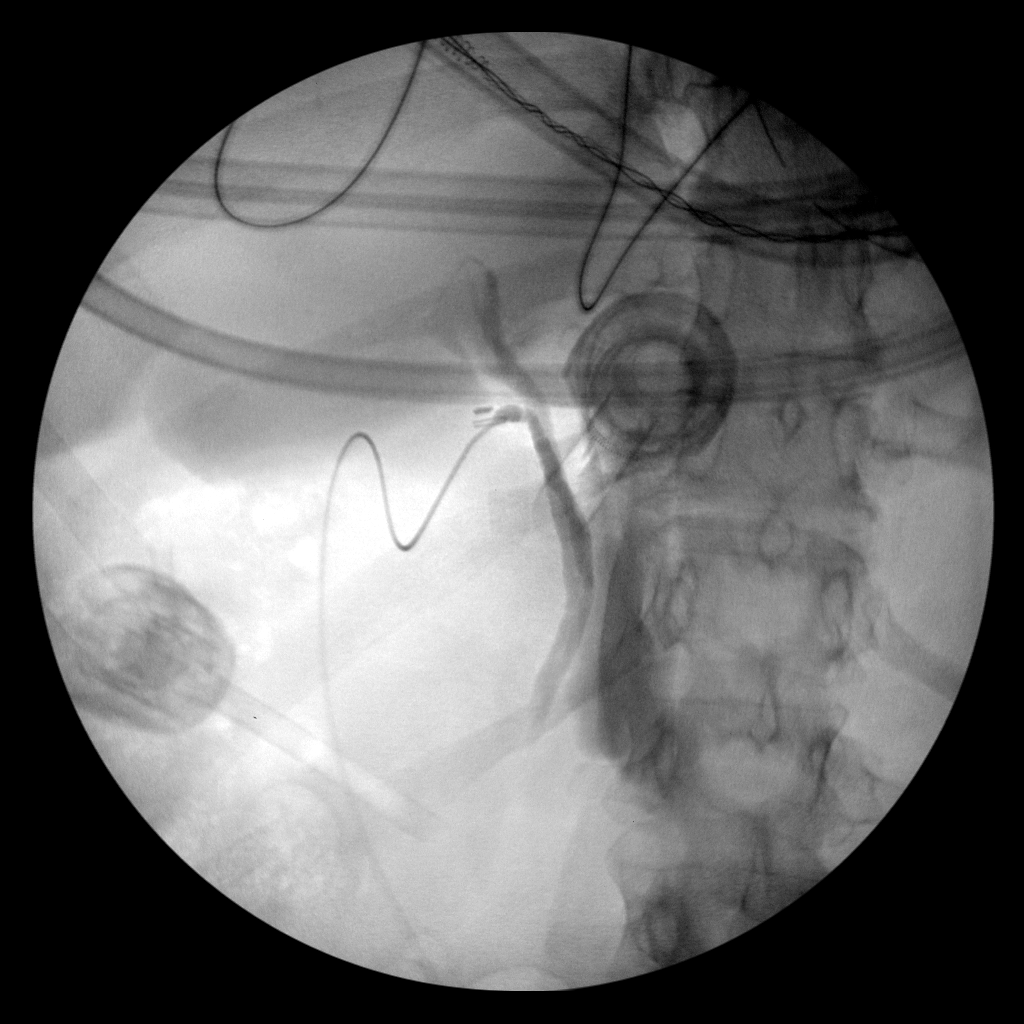
[frame 38/74]
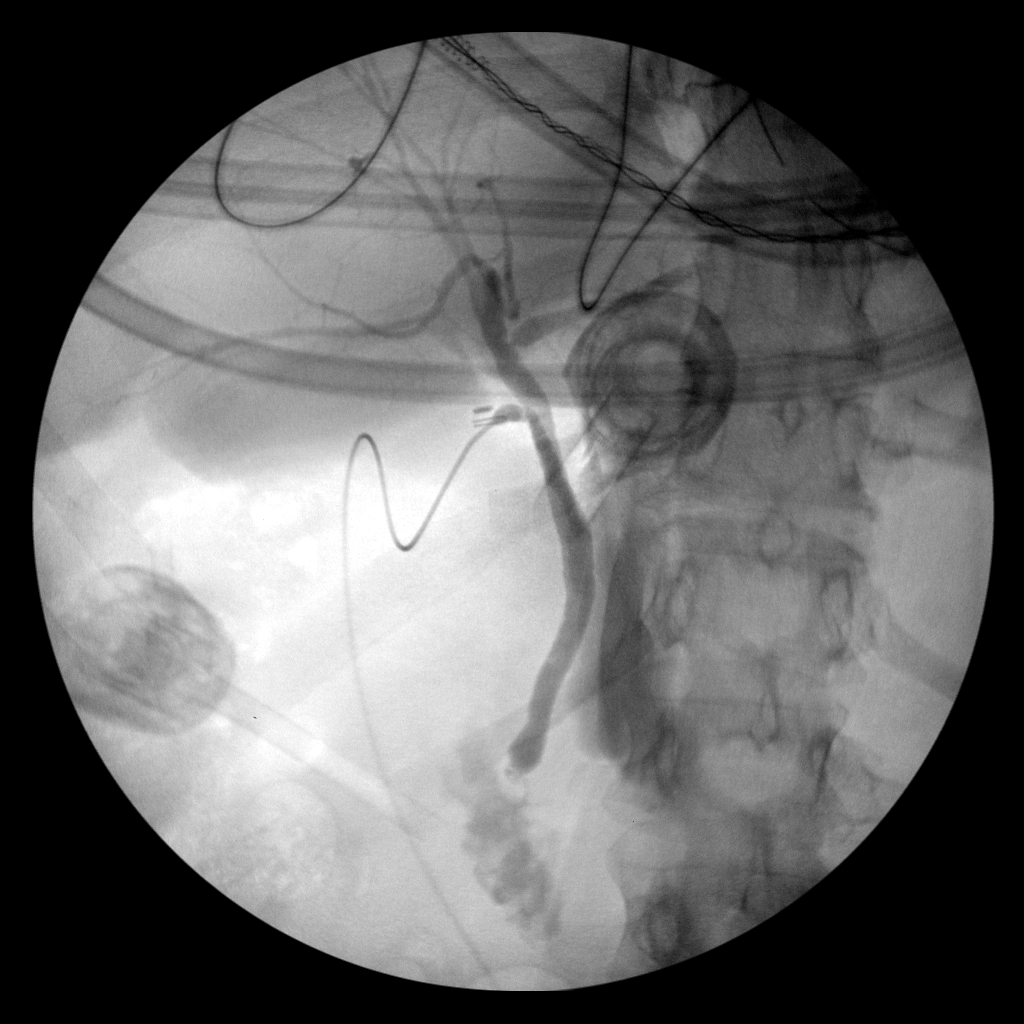
[frame 63/74]
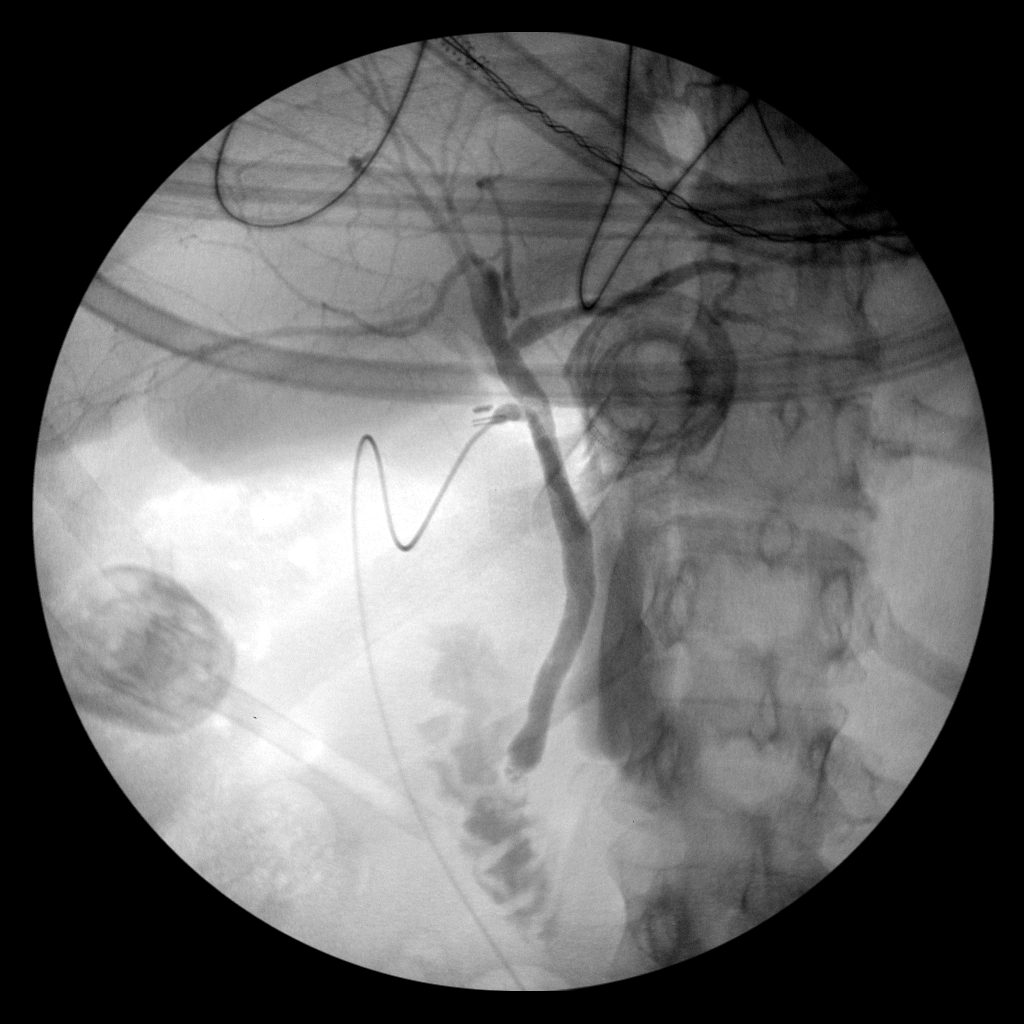
[frame 74/74]
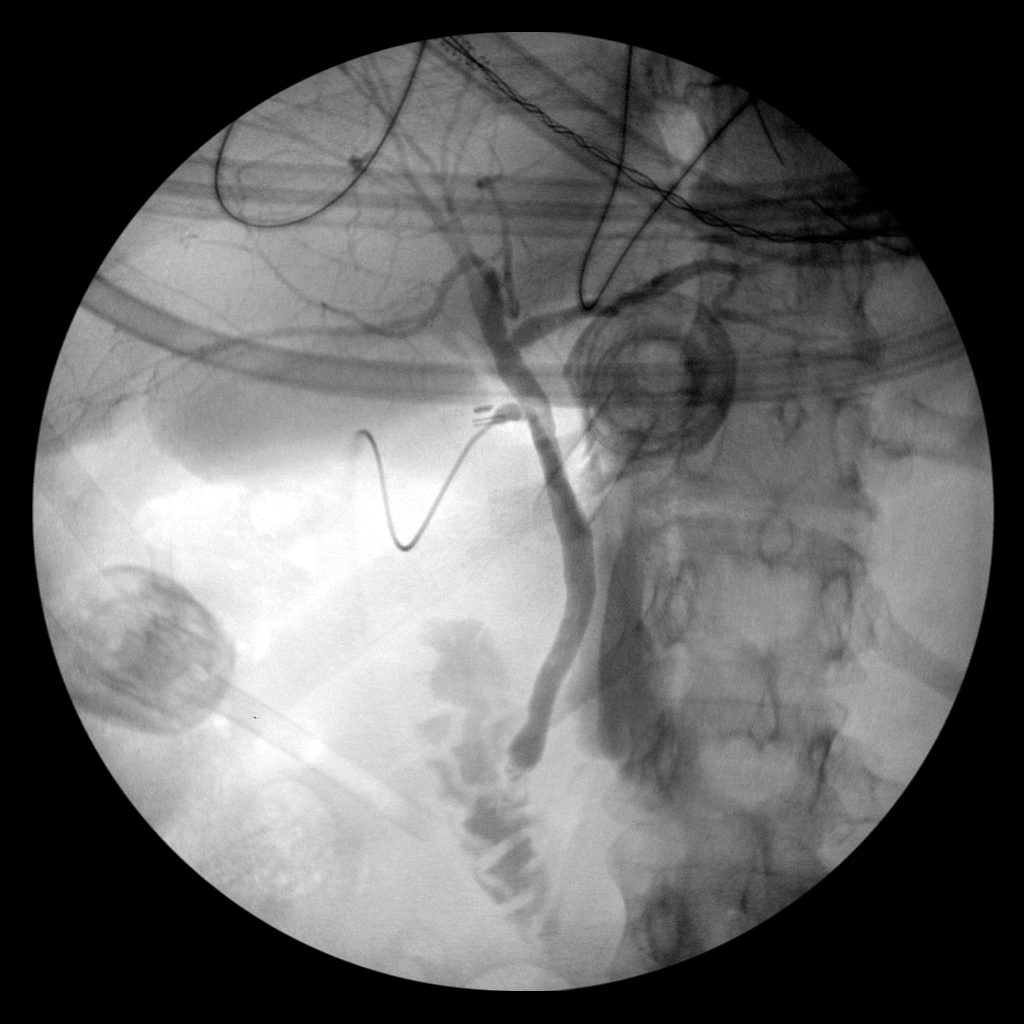

[4 of 4 positions shown; findings below may reference images not displayed]

FINDINGS: Intraoperative cholangiogram performed during the laparoscopic
procedure. The residual cystic duct, common hepatic duct, common
bile duct are patent. No dilatation, obstruction, stricture, or
large filling defect. Contrast drains easily into the duodenum.
IMPRESSION: Patent biliary system.

## 2020-03-15 ENCOUNTER — Other Ambulatory Visit: Payer: BC Managed Care – PPO

## 2020-03-16 ENCOUNTER — Ambulatory Visit
Admission: RE | Admit: 2020-03-16 | Discharge: 2020-03-16 | Disposition: A | Discharge status: Home / Self Care | Payer: BC Managed Care – PPO | Source: Ambulatory Visit | Attending: Gynecology | Admitting: Gynecology

## 2020-03-16 ENCOUNTER — Other Ambulatory Visit: Payer: Self-pay

## 2020-03-16 DIAGNOSIS — N6310 Unspecified lump in the right breast, unspecified quadrant: Secondary | ICD-10-CM

## 2020-04-15 ENCOUNTER — Other Ambulatory Visit: Payer: Self-pay | Admitting: Orthopaedic Surgery

## 2020-04-15 DIAGNOSIS — M79671 Pain in right foot: Secondary | ICD-10-CM

## 2020-05-04 ENCOUNTER — Ambulatory Visit
Admission: RE | Admit: 2020-05-04 | Discharge: 2020-05-04 | Disposition: A | Discharge status: Home / Self Care | Payer: BC Managed Care – PPO | Source: Ambulatory Visit | Attending: Orthopaedic Surgery | Admitting: Orthopaedic Surgery

## 2020-05-04 DIAGNOSIS — M79671 Pain in right foot: Secondary | ICD-10-CM

## 2022-02-21 ENCOUNTER — Other Ambulatory Visit: Payer: Self-pay | Admitting: Internal Medicine

## 2022-02-21 DIAGNOSIS — E785 Hyperlipidemia, unspecified: Secondary | ICD-10-CM

## 2022-03-06 ENCOUNTER — Ambulatory Visit: Payer: BC Managed Care – PPO | Admitting: Interventional Cardiology

## 2022-03-28 ENCOUNTER — Ambulatory Visit
Admission: RE | Admit: 2022-03-28 | Discharge: 2022-03-28 | Disposition: A | Discharge status: Home / Self Care | Payer: No Typology Code available for payment source | Source: Ambulatory Visit | Attending: Internal Medicine | Admitting: Internal Medicine

## 2022-03-28 DIAGNOSIS — E785 Hyperlipidemia, unspecified: Secondary | ICD-10-CM

## 2022-11-27 ENCOUNTER — Encounter: Payer: Self-pay | Admitting: Podiatry

## 2022-11-27 ENCOUNTER — Ambulatory Visit: Payer: BC Managed Care – PPO | Admitting: Podiatry

## 2022-11-27 ENCOUNTER — Ambulatory Visit (INDEPENDENT_AMBULATORY_CARE_PROVIDER_SITE_OTHER): Payer: BC Managed Care – PPO

## 2022-11-27 VITALS — BP 147/64 | HR 113

## 2022-11-27 DIAGNOSIS — M7751 Other enthesopathy of right foot: Secondary | ICD-10-CM | POA: Diagnosis not present

## 2022-11-27 MED ORDER — METHYLPREDNISOLONE 4 MG PO TBPK: ORAL_TABLET | ORAL | 0 refills | Status: DC

## 2022-11-27 MED ORDER — BETAMETHASONE SOD PHOS & ACET 6 (3-3) MG/ML IJ SUSP
3.0000 mg | Freq: Once | INTRAMUSCULAR | Status: AC
  Administered 2022-11-27: 3 mg via INTRA_ARTICULAR

## 2022-11-27 NOTE — Progress Notes (Signed)
   Chief Complaint  Patient presents with   Foot Pain    Patient is here for right foot in the ball of her foot that she has had for months.    HPI: 61 y.o. female presenting today as a new patient for evaluation of right forefoot pain.  Pain has been ongoing for about 2 months.  Denies any change in shoe gear or activity.  Gradual onset.  Denies a history of injury.  She has tried different shoes with no significant improvement.  Past Medical History:  Diagnosis Date   Anxiety    Arthritis    Depression    GERD (gastroesophageal reflux disease)    Migraine    hx of migraines    Pre-diabetes     Past Surgical History:  Procedure Laterality Date   CESAREAN SECTION     CHOLECYSTECTOMY N/A 11/12/2018   Procedure: LAPAROSCOPIC CHOLECYSTECTOMY WITH INTRAOPERATIVE CHOLANGIOGRAM;  Surgeon: Berna Bue, MD;  Location: [facility];  Service: General;  Laterality: N/A;   HYSTEROSCOPY     WISDOM TOOTH EXTRACTION      Allergies  Allergen Reactions   Codeine Nausea Only     Physical Exam: General: The patient is alert and oriented x3 in no acute distress.  Dermatology: Skin is warm, dry and supple bilateral lower extremities. Negative for open lesions or macerations.  Vascular: Palpable pedal pulses bilaterally. Capillary refill within normal limits.  Negative for any significant edema or erythema  Neurological: Light touch and protective threshold grossly intact  Musculoskeletal Exam: No pedal deformities noted.  There is some tenderness with palpation and range of motion of the second toe right foot.  Generalized tenderness throughout the entire forefoot as well.  Radiographic Exam RT foot 11/27/2022:  Normal osseous mineralization. Joint spaces preserved. No fracture/dislocation/boney destruction.    Assessment: 1.  Second metatarsophalangeal joint capsulitis right 2.  Global metatarsalgia right forefoot   Plan of Care:  1. Patient evaluated. X-Rays reviewed.  2.   Injection of 0.5 cc Celestone Soluspan injected into the second MTP right 3.  Prescription for Medrol Dosepak 4.  Continue OTC Motrin as needed 5.  Recommend arch supports to support the medial longitudinal arch of the foot and offload pressure from the forefoot.  Recommended going to [employer].  We did discuss the possibility of custom molded orthotics however we will try OTC arch supports initially 6.  Return to clinic as needed      Felecia Shelling, DPM [facility]  Dr. Felecia Shelling, DPM    [address] [place], [place] [address]                Office [phone]  Fax [phone]

## 2023-01-17 ENCOUNTER — Encounter: Payer: Self-pay | Admitting: Podiatry

## 2023-02-05 ENCOUNTER — Encounter: Payer: Self-pay | Admitting: Podiatry

## 2023-02-05 IMAGING — CT CT CARDIAC CORONARY ARTERY CALCIUM SCORE
3 series · 14 of 20 positions shown, 16 images · non-contrast
Comparison: None.

CLINICAL DATA: 60-year-old Caucasian female with hypertension and
hypercholesterolemia.

EXAM:
CT CARDIAC CORONARY ARTERY CALCIUM SCORE
TECHNIQUE: Non-contrast imaging through the heart was performed using
prospective ECG gating. Image post processing was performed on an
independent workstation, allowing for quantitative analysis of the
heart and coronary arteries. Note that this exam targets the heart
and the chest was not imaged in its entirety.

[Series 2: calcium scoring 2.00 qr36 bestdiast 71% hrt calciu · axial · 0.35mm/px · z∈[+1692,+1766]mm · 4 of 63 slices shown]
[im 13/63  vessel]
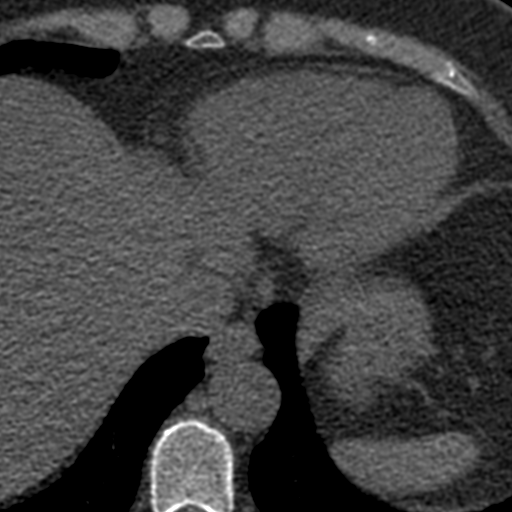
[im 25/63  vessel]
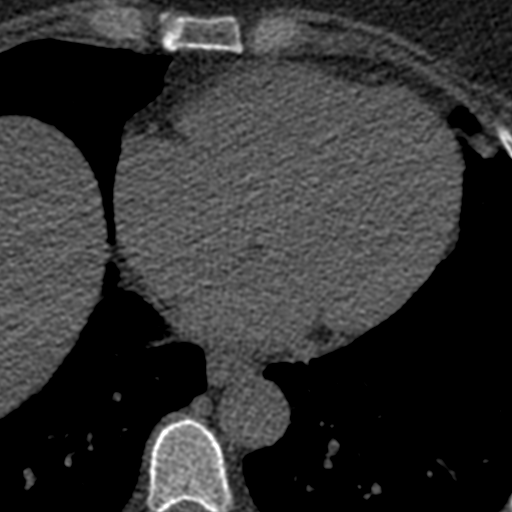
[im 38/63  vessel]
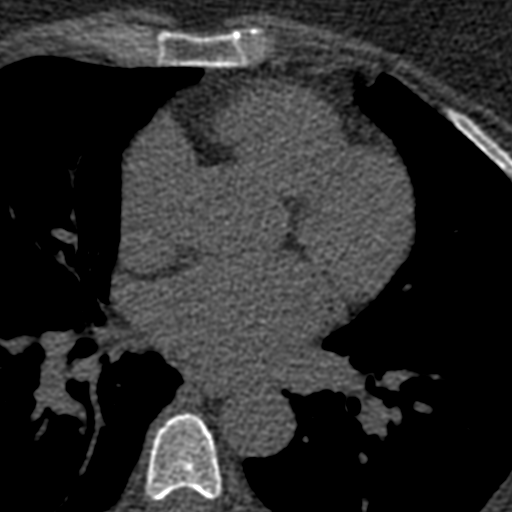
[im 50/63  vessel]
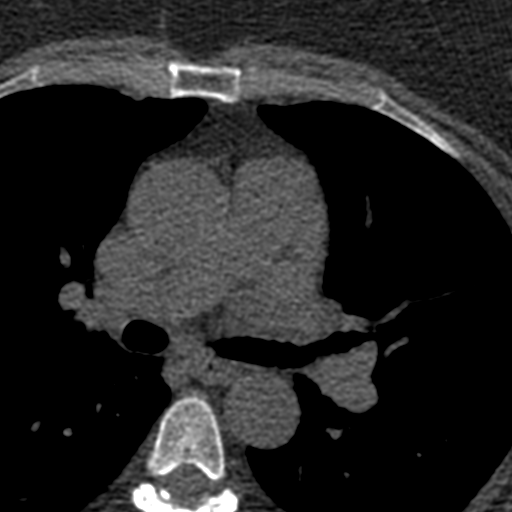

[Series 3: calcium scoring 2.00 br40 bestdiast 71% axial · axial · 0.59mm/px · z∈[+1682,+1770]mm · 5 of 66 slices shown, 7 images]
[im 11/66  vessel]
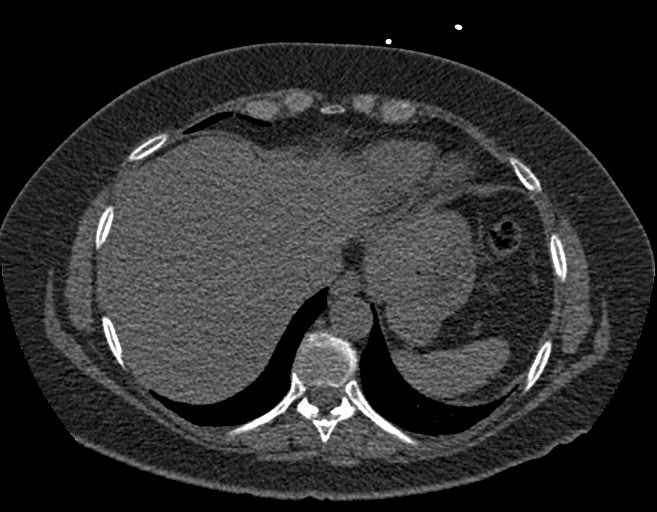
[im 11/66  lung]
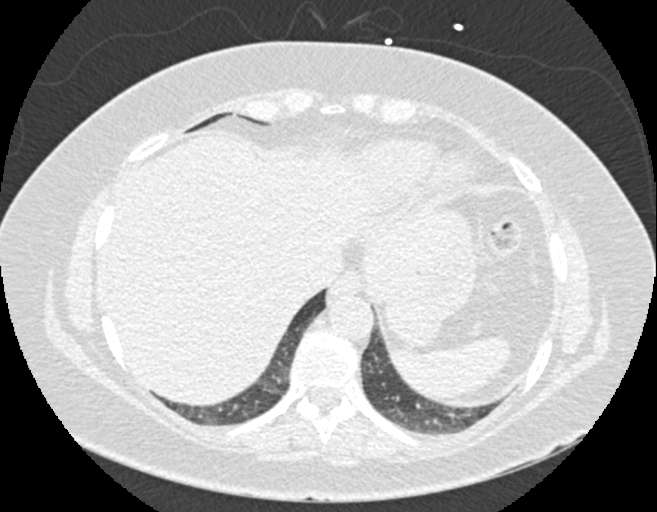
[im 22/66  vessel]
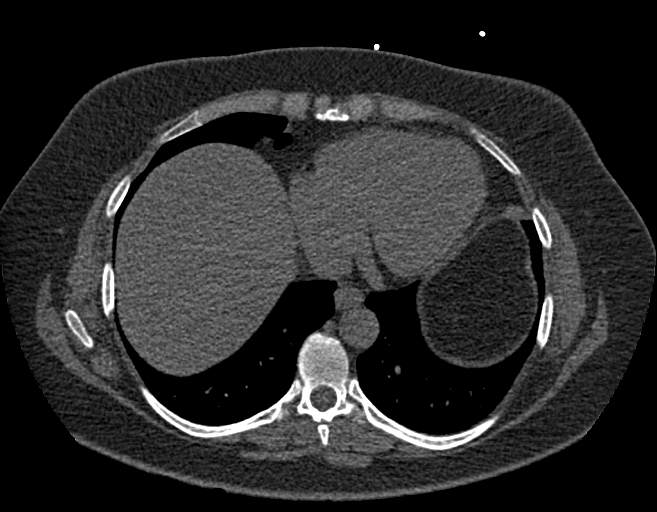
[im 33/66  vessel]
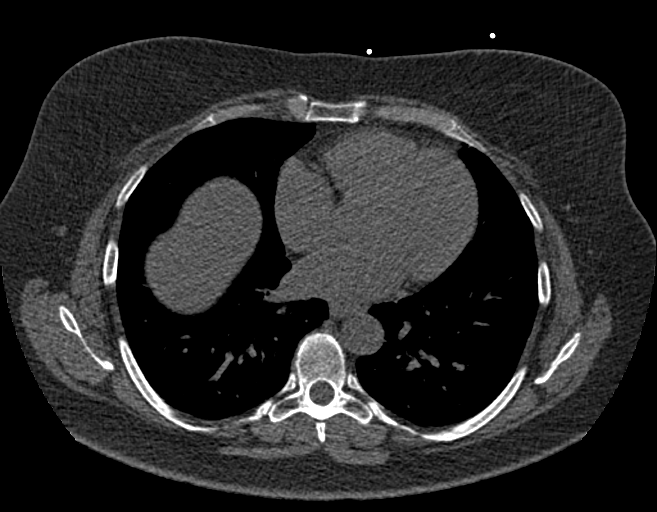
[im 44/66  vessel]
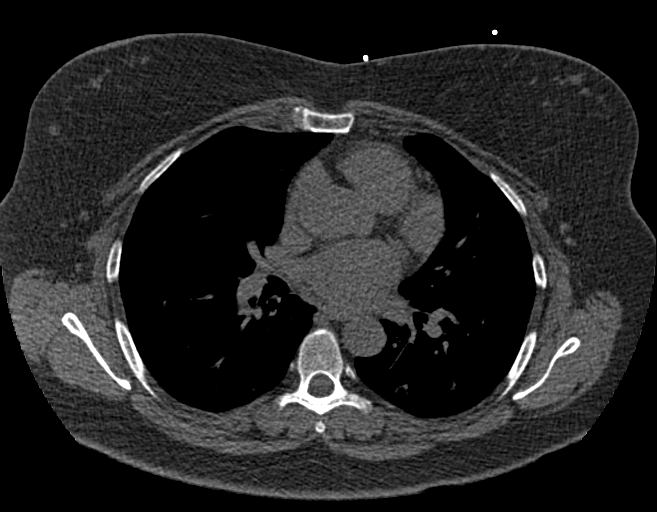
[im 55/66  vessel]
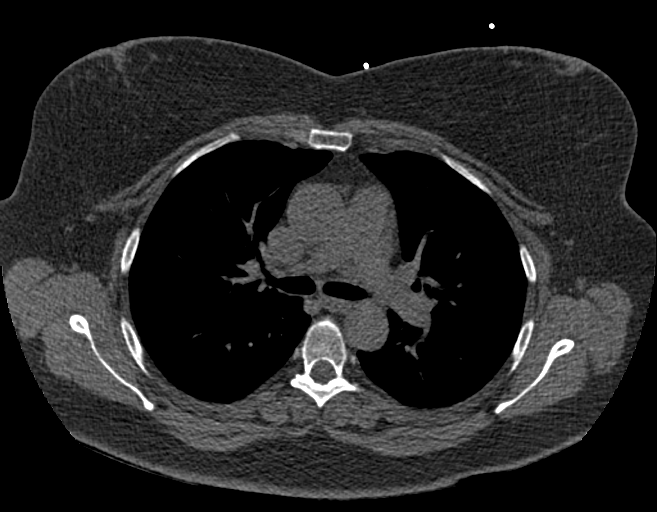
[im 55/66  lung]
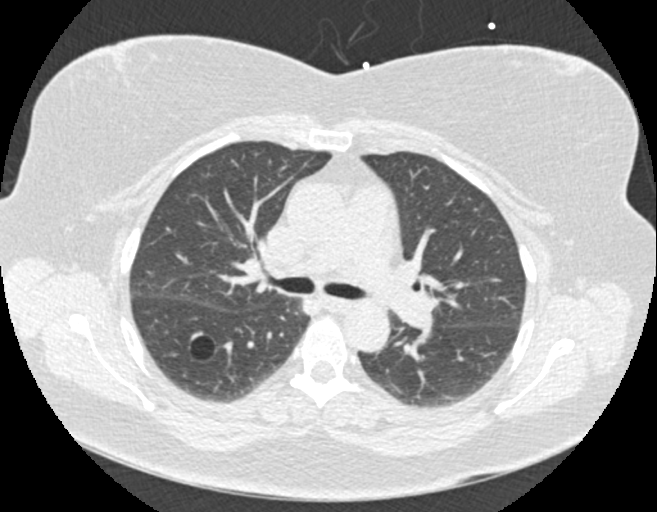

[Series 9: calcium scoring 2.00 br60 bestdiast 71% lungs · axial · 0.59mm/px · z∈[+1682,+1770]mm · 5 of 66 slices shown]
[im 11/66  vessel]
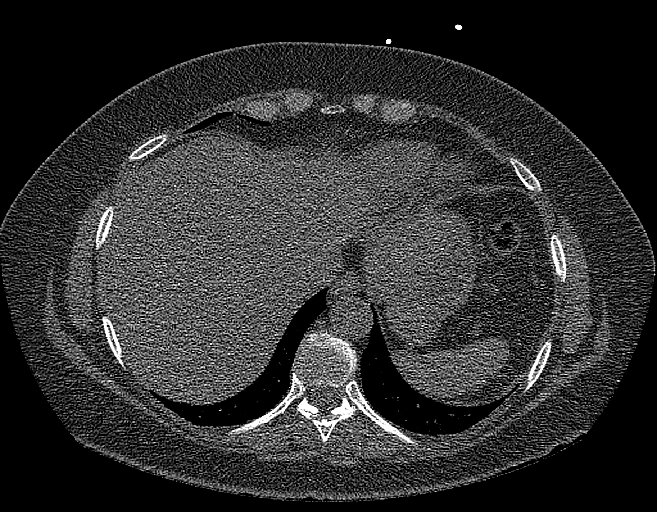
[im 22/66  vessel]
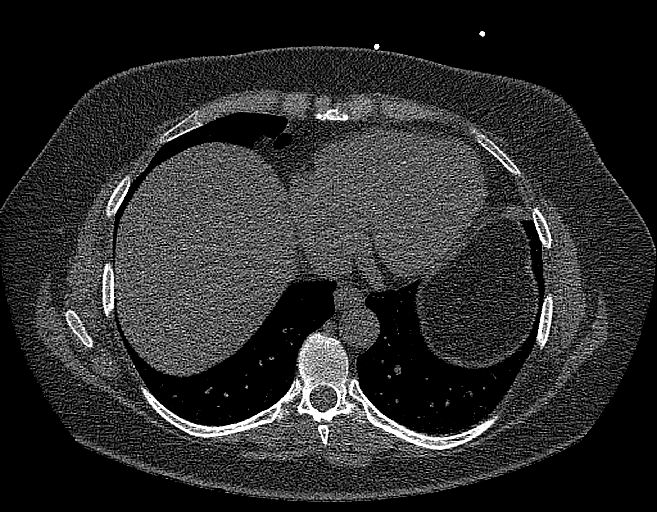
[im 33/66  vessel]
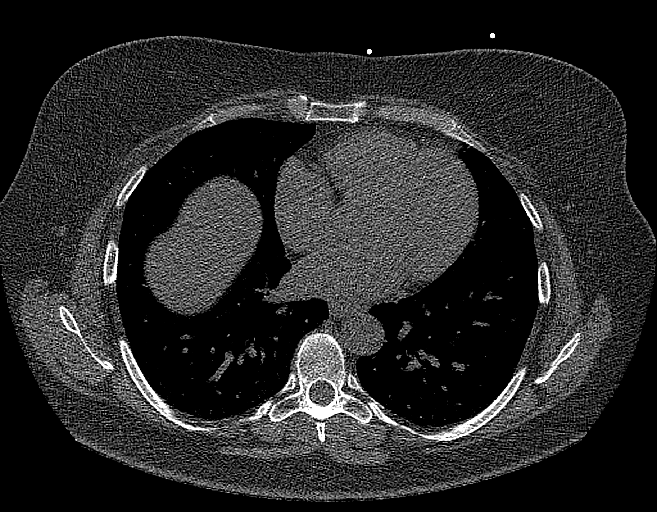
[im 44/66  vessel]
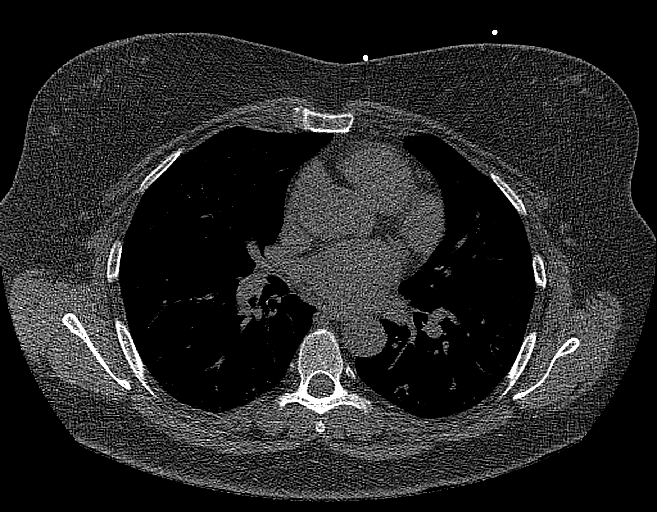
[im 55/66  vessel]
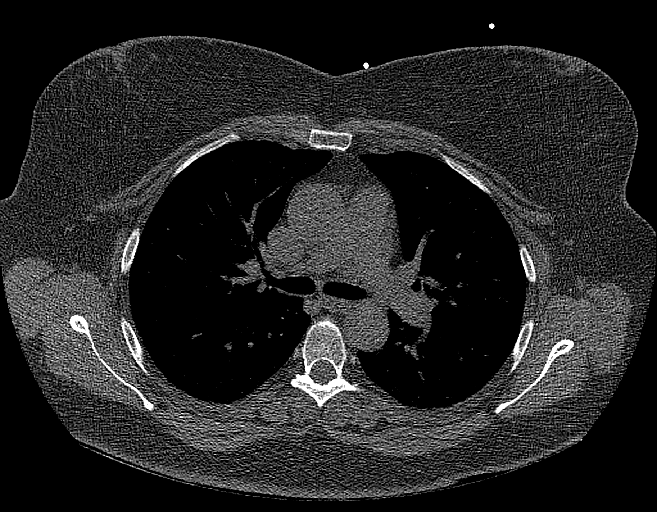

[14 of 20 positions shown; findings below may reference images not displayed]

FINDINGS: CORONARY CALCIUM SCORES:

Left Main: 0

LAD: 0

LCx: 0

RCA: 0

Total Agatston Score: 0

[HOSPITAL] percentile: Zeroth

AORTA MEASUREMENTS:

Ascending Aorta: 35 mm

Descending Aorta: 26 mm

OTHER FINDINGS:

Cardiovascular: The heart is normal in size. No pericardial
effusion. No significant atherosclerotic calcifications the
visualized thoracic aorta.

Mediastinum/Nodes: The visualized trachea and esophagus are within
normal limits. No mediastinal lymphadenopathy.

Lungs/Pleura: No suspicious pulmonary nodules. Incidentally noted
1.5 cm pneumatocele in the superior segment of the right lower lobe.
No evidence of pleural effusion or pneumothorax.

Upper Abdomen: Mild decreased attenuation of the hepatic parenchyma
which is otherwise within normal limits of size and contour. The
remaining visualized upper abdomen is within normal limits.

Musculoskeletal: No acute osseous abnormality.
IMPRESSION: 1. No evidence of coronary atherosclerotic calcifications. The
observed calcium score of 0 is at the zeroth percentile for subjects
of the same age, gender, and race/ethnicity who are free of clinical
cardiovascular disease and treated diabetes.
2. Mild hepatic steatosis.

## 2023-03-26 ENCOUNTER — Ambulatory Visit: Payer: BC Managed Care – PPO | Admitting: Podiatry

## 2023-03-26 DIAGNOSIS — M19071 Primary osteoarthritis, right ankle and foot: Secondary | ICD-10-CM | POA: Diagnosis not present

## 2023-03-26 MED ORDER — BETAMETHASONE SOD PHOS & ACET 6 (3-3) MG/ML IJ SUSP
3.0000 mg | Freq: Once | INTRAMUSCULAR | Status: AC
  Administered 2023-03-26: 3 mg via INTRA_ARTICULAR

## 2023-03-26 MED ORDER — METHYLPREDNISOLONE 4 MG PO TBPK: ORAL_TABLET | ORAL | 0 refills | Status: DC

## 2023-03-26 MED ORDER — MELOXICAM 15 MG PO TABS: 15.0000 mg | ORAL_TABLET | Freq: Every day | ORAL | 1 refills | Status: DC

## 2023-03-26 NOTE — Progress Notes (Signed)
   Chief Complaint  Patient presents with   Foot Pain    Came in today right foot pain,ball of the foot, rate of pain 4 out of 10, patient states she is doing much better but is going on a trip were she is going to be walking a lot and patient would like an injection today    HPI: 62 y.o. female presenting today for follow-up evaluation of right forefoot pain.  Patient states that the last injection she received 11/27/2022 helped significantly.  She is slowly having a return of the pain.  She is nervous because she is leaving on a trip for [place].  Requesting another injection today.  Past Medical History:  Diagnosis Date   Anxiety    Arthritis    Depression    GERD (gastroesophageal reflux disease)    Migraine    hx of migraines    Pre-diabetes     Past Surgical History:  Procedure Laterality Date   CESAREAN SECTION     CHOLECYSTECTOMY N/A 11/12/2018   Procedure: LAPAROSCOPIC CHOLECYSTECTOMY WITH INTRAOPERATIVE CHOLANGIOGRAM;  Surgeon: Berna Bue, MD;  Location: [facility];  Service: General;  Laterality: N/A;   HYSTEROSCOPY     WISDOM TOOTH EXTRACTION      Allergies  Allergen Reactions   Codeine Nausea Only     Physical Exam: General: The patient is alert and oriented x3 in no acute distress.  Dermatology: Skin is warm, dry and supple bilateral lower extremities. Negative for open lesions or macerations.  Vascular: Palpable pedal pulses bilaterally. Capillary refill within normal limits.  Negative for any significant edema or erythema  Neurological: Light touch and protective threshold grossly intact  Musculoskeletal Exam: Hammertoe deformity noted second digit right foot.  There is some tenderness with palpation and range of motion of the second toe right foot.  Generalized tenderness throughout the entire forefoot as well.  Radiographic Exam RT foot 11/27/2022:  Normal osseous mineralization. Joint spaces preserved. No fracture/dislocation/boney destruction.     Assessment: 1.  Second metatarsophalangeal joint capsulitis right 2.  Global metatarsalgia right forefoot 3.  Hammertoe contracture deformity second digit right foot   Plan of Care:  1. Patient evaluated.  We will proceed with the same regimen that we did back in December since she had such good results 2.  Injection of 0.5 cc Celestone Soluspan injected into the second MTP right 3.  Prescription for Medrol Dosepak 4.  Continue OTC Motrin as needed 5.  Continue prefabricated OTC arch supports to support the medial longitudinal arch of the foot and offload pressure from the forefoot.   6.  Return to clinic as needed  *Leaving 04/15/2023 to [place] for vacation   Felecia Shelling, DPM [facility]  Dr. Felecia Shelling, DPM    [address] [place], [place] [address]                Office [phone]  Fax [phone]

## 2023-09-27 ENCOUNTER — Encounter: Payer: Self-pay | Admitting: Podiatry

## 2023-09-28 NOTE — Telephone Encounter (Signed)
Could we please get this patient in to be seen before 10/17 either by me or anyone available?  THANKS!!! -Dr. Logan Bores

## 2023-10-01 ENCOUNTER — Ambulatory Visit (INDEPENDENT_AMBULATORY_CARE_PROVIDER_SITE_OTHER): Payer: No Typology Code available for payment source | Admitting: Podiatry

## 2023-10-01 ENCOUNTER — Encounter: Payer: Self-pay | Admitting: Podiatry

## 2023-10-01 VITALS — HR 88 | Temp 97.0°F | Resp 18 | Ht 67.0 in | Wt 196.0 lb

## 2023-10-01 DIAGNOSIS — M722 Plantar fascial fibromatosis: Secondary | ICD-10-CM

## 2023-10-01 DIAGNOSIS — R52 Pain, unspecified: Secondary | ICD-10-CM | POA: Diagnosis not present

## 2023-10-01 MED ORDER — MELOXICAM 15 MG PO TABS: 15.0000 mg | ORAL_TABLET | Freq: Every day | ORAL | 1 refills | Status: DC

## 2023-10-01 MED ORDER — BETAMETHASONE SOD PHOS & ACET 6 (3-3) MG/ML IJ SUSP
3.0000 mg | Freq: Once | INTRAMUSCULAR | Status: AC
  Administered 2023-10-01: 3 mg via INTRA_ARTICULAR

## 2023-10-01 MED ORDER — METHYLPREDNISOLONE 4 MG PO TBPK: ORAL_TABLET | ORAL | 0 refills | Status: DC

## 2023-10-01 NOTE — Progress Notes (Signed)
   Chief Complaint  Patient presents with   Foot Pain    RM8: patient is here for left heel pain for 2 weeks    Subjective: 62 y.o. female presenting today for new complaint of pain and tenderness and acute exacerbation of left heel pain over the last 2 weeks.  Idiopathic onset.  She does have a history of plantar fasciitis.  She has been wearing her Aetrex insoles and doing home remedy exercises with no relief.   Past Medical History:  Diagnosis Date   Anxiety    Arthritis    Depression    GERD (gastroesophageal reflux disease)    Migraine    hx of migraines    Pre-diabetes      Objective: Physical Exam General: The patient is alert and oriented x3 in no acute distress.  Dermatology: Skin is warm, dry and supple bilateral lower extremities. Negative for open lesions or macerations bilateral.   Vascular: Dorsalis Pedis and Posterior Tibial pulses palpable bilateral.  Capillary fill time is immediate to all digits.  Neurological: Grossly intact via light touch  Musculoskeletal: Tenderness to palpation to the plantar aspect of the left heel along the plantar fascia. All other joints range of motion within normal limits bilateral. Strength 5/5 in all groups bilateral.   Assessment: 1. Plantar fasciitis left foot x 2 weeks  Plan of Care:  -Patient evaluated.   -Injection of 0.5cc Celestone soluspan injected into the left plantar fascia.  -Rx for Medrol Dose Pak placed -Rx for Meloxicam 15 mg daily ordered for patient. -Continue wearing Aetrex insoles from [employer] -Return to clinic as needed  *Leaving for beach trip 10/17. Recently retired   Mary Moran, DPM [facility]  Dr. Felecia Moran, DPM    [address] [place], [place] [address]                Office [phone]  Fax [phone]

## 2023-11-08 ENCOUNTER — Encounter: Payer: Self-pay | Admitting: Podiatry

## 2023-11-09 ENCOUNTER — Ambulatory Visit (INDEPENDENT_AMBULATORY_CARE_PROVIDER_SITE_OTHER): Payer: No Typology Code available for payment source | Admitting: Podiatry

## 2023-11-09 ENCOUNTER — Encounter: Payer: Self-pay | Admitting: Podiatry

## 2023-11-09 ENCOUNTER — Telehealth: Payer: Self-pay | Admitting: Podiatry

## 2023-11-09 DIAGNOSIS — M722 Plantar fascial fibromatosis: Secondary | ICD-10-CM

## 2023-11-09 MED ORDER — TRIAMCINOLONE ACETONIDE 10 MG/ML IJ SUSP
10.0000 mg | Freq: Once | INTRAMUSCULAR | Status: AC
  Administered 2023-11-09: 10 mg via INTRA_ARTICULAR

## 2023-11-09 NOTE — Telephone Encounter (Signed)
Left pt message today asking for pt to call me directly and I could possibly get her in today or next week with a different day or 12/9 with Dr Logan Bores.

## 2023-11-12 NOTE — Progress Notes (Signed)
Subjective:   Patient ID: Mary Moran, female   DOB: 62 y.o.   MRN: [id number]   HPI Patient states having a lot of pain in the bottom of the left heel again and states that only had temporary relief with previous treatment.  States the pain is worse when getting up in the morning after periods of sitting   ROS      Objective:  Physical Exam  Neurovascular status intact muscle strength found to be adequate range of motion adequate exquisite discomfort medial fascial band left at the insertional point of the tendon calcaneus with fluid buildup noted     Assessment:  Acute plantar fasciitis left worse after periods of sitting and when getting up in the morning     Plan:  H&P discussed the importance of stretch and dispensed night splint properly fitted to the lower leg to offload.  This is a over-the-counter static device to provide for consistent stretching of the plantar fascia.  I did go ahead today and due to the intense this I did do sterile prep and reinjected the fascia 3 mg Kenalog 5 mg Xylocaine

## 2023-12-11 ENCOUNTER — Other Ambulatory Visit: Payer: Self-pay | Admitting: Podiatry

## 2023-12-11 MED ORDER — MELOXICAM 15 MG PO TABS: 15.0000 mg | ORAL_TABLET | Freq: Every day | ORAL | 2 refills | Status: AC

## 2023-12-11 NOTE — Progress Notes (Signed)
Rx for 90 days supply meloxicam 15 mg sent to [facility]

## 2024-04-26 ENCOUNTER — Other Ambulatory Visit: Payer: Self-pay | Admitting: Podiatry

## 2024-11-17 ENCOUNTER — Ambulatory Visit (INDEPENDENT_AMBULATORY_CARE_PROVIDER_SITE_OTHER)

## 2024-11-17 ENCOUNTER — Encounter: Payer: Self-pay | Admitting: Podiatry

## 2024-11-17 ENCOUNTER — Ambulatory Visit: Admitting: Podiatry

## 2024-11-17 DIAGNOSIS — M19072 Primary osteoarthritis, left ankle and foot: Secondary | ICD-10-CM

## 2024-11-17 DIAGNOSIS — M19071 Primary osteoarthritis, right ankle and foot: Secondary | ICD-10-CM

## 2024-11-17 NOTE — Progress Notes (Signed)
   Chief Complaint  Patient presents with   Foot Pain    Dorsal midfoot bilateral - aching x months, no injury, thinks maybe have some arthritis, hammertoe deformity 2nd toe right that hurts when really cold    HPI: 63 y.o. female presenting today for evaluation of bilateral midfoot pain.  Over the last few weeks it has improved significantly.  She recently went on a trip to [place] and did a significant amount of walking.  Since she has been home the foot has been able to rest and she feels better.  Past Medical History:  Diagnosis Date   Anxiety    Arthritis    Depression    GERD (gastroesophageal reflux disease)    Migraine    hx of migraines    Pre-diabetes     Past Surgical History:  Procedure Laterality Date   CESAREAN SECTION     CHOLECYSTECTOMY N/A 11/12/2018   Procedure: LAPAROSCOPIC CHOLECYSTECTOMY WITH INTRAOPERATIVE CHOLANGIOGRAM;  Surgeon: Signe Mitzie LABOR, MD;  Location: [facility];  Service: General;  Laterality: N/A;   HYSTEROSCOPY     WISDOM TOOTH EXTRACTION      Allergies  Allergen Reactions   Codeine Nausea Only     Physical Exam: General: The patient is alert and oriented x3 in no acute distress.  Dermatology: Skin is warm, dry and supple bilateral lower extremities. Negative for open lesions or macerations.  Vascular: Palpable pedal pulses bilaterally. Capillary refill within normal limits.  Negative for any significant edema or erythema  Neurological: Light touch and protective threshold grossly intact  Musculoskeletal Exam: Hammertoe deformity noted second digit bilateral.  There is some generalized tenderness throughout palpation throughout the bilateral midfoot  Radiographic Exam RT foot 11/17/2024:  Normal osseous mineralization.  Mild degenerative changes noted throughout the lesser TMT bilateral  Assessment: 1.  Bilateral TMT arthritis 2.  Hammertoe second digit bilateral   Plan of Care:  -Patient evaluated.   - Since making her appointment  she says the foot feels significantly better.  No injection - Patient has a Medrol  Dosepak at home.  Recommend taking as directed -Continue OTC Motrin as needed -Continue prefabricated OTC arch supports to support the medial longitudinal arch of the foot and offload pressure from the forefoot.   -Return to clinic as needed   Thresa EMERSON Sar, DPM [facility]  Dr. Thresa EMERSON Sar, DPM    [address] [place], [place] [address]                Office [phone]  Fax [phone]
